# Patient Record
Sex: Female | Born: 1955 | Race: Black or African American | Hispanic: No | Smoking: Former smoker
Health system: Southern US, Community
[De-identification: ages and names within clinical notes are randomized; demographics above are authoritative.]

## PROBLEM LIST (undated history)

## (undated) ENCOUNTER — Emergency Department (HOSPITAL_COMMUNITY): Admission: EM | Payer: Medicare HMO | Source: Home / Self Care

## (undated) DIAGNOSIS — H269 Unspecified cataract: Secondary | ICD-10-CM

## (undated) DIAGNOSIS — F1910 Other psychoactive substance abuse, uncomplicated: Secondary | ICD-10-CM

## (undated) DIAGNOSIS — B019 Varicella without complication: Secondary | ICD-10-CM

## (undated) DIAGNOSIS — R7611 Nonspecific reaction to tuberculin skin test without active tuberculosis: Secondary | ICD-10-CM

## (undated) DIAGNOSIS — D649 Anemia, unspecified: Secondary | ICD-10-CM

## (undated) DIAGNOSIS — T7840XA Allergy, unspecified, initial encounter: Secondary | ICD-10-CM

## (undated) DIAGNOSIS — G4700 Insomnia, unspecified: Secondary | ICD-10-CM

## (undated) HISTORY — PX: ABDOMINAL HYSTERECTOMY: SHX81

## (undated) HISTORY — PX: COLONOSCOPY: SHX174

## (undated) HISTORY — DX: Nonspecific reaction to tuberculin skin test without active tuberculosis: R76.11

## (undated) HISTORY — DX: Insomnia, unspecified: G47.00

## (undated) HISTORY — DX: Varicella without complication: B01.9

## (undated) HISTORY — DX: Unspecified cataract: H26.9

## (undated) HISTORY — DX: Other psychoactive substance abuse, uncomplicated: F19.10

## (undated) HISTORY — DX: Anemia, unspecified: D64.9

## (undated) HISTORY — DX: Allergy, unspecified, initial encounter: T78.40XA

---

## 1998-09-02 ENCOUNTER — Encounter: Payer: Self-pay | Admitting: Emergency Medicine

## 1998-09-02 ENCOUNTER — Emergency Department (HOSPITAL_COMMUNITY): Admission: EM | Admit: 1998-09-02 | Discharge: 1998-09-02 | Payer: Self-pay | Admitting: Emergency Medicine

## 2000-05-08 ENCOUNTER — Emergency Department (HOSPITAL_COMMUNITY): Admission: EM | Admit: 2000-05-08 | Discharge: 2000-05-09 | Payer: Self-pay | Admitting: Emergency Medicine

## 2000-05-10 ENCOUNTER — Other Ambulatory Visit (HOSPITAL_COMMUNITY): Admission: RE | Admit: 2000-05-10 | Discharge: 2000-06-01 | Payer: Self-pay | Admitting: Psychiatry

## 2002-06-23 ENCOUNTER — Emergency Department (HOSPITAL_COMMUNITY): Admission: EM | Admit: 2002-06-23 | Discharge: 2002-06-23 | Payer: Self-pay

## 2002-06-25 ENCOUNTER — Emergency Department (HOSPITAL_COMMUNITY): Admission: EM | Admit: 2002-06-25 | Discharge: 2002-06-25 | Payer: Self-pay | Admitting: Emergency Medicine

## 2002-06-25 ENCOUNTER — Encounter: Payer: Self-pay | Admitting: Emergency Medicine

## 2002-06-29 ENCOUNTER — Emergency Department (HOSPITAL_COMMUNITY): Admission: EM | Admit: 2002-06-29 | Discharge: 2002-06-29 | Payer: Self-pay | Admitting: Emergency Medicine

## 2005-12-15 ENCOUNTER — Ambulatory Visit: Payer: Self-pay | Admitting: Family Medicine

## 2006-01-05 ENCOUNTER — Ambulatory Visit: Payer: Self-pay | Admitting: *Deleted

## 2006-02-22 ENCOUNTER — Ambulatory Visit: Payer: Self-pay | Admitting: Family Medicine

## 2006-08-16 ENCOUNTER — Ambulatory Visit: Payer: Self-pay | Admitting: Family Medicine

## 2006-10-20 ENCOUNTER — Encounter (INDEPENDENT_AMBULATORY_CARE_PROVIDER_SITE_OTHER): Payer: Self-pay | Admitting: *Deleted

## 2007-03-14 ENCOUNTER — Ambulatory Visit: Payer: Self-pay | Admitting: Family Medicine

## 2007-03-17 ENCOUNTER — Ambulatory Visit (HOSPITAL_COMMUNITY): Admission: RE | Admit: 2007-03-17 | Discharge: 2007-03-17 | Payer: Self-pay | Admitting: Family Medicine

## 2007-10-07 ENCOUNTER — Ambulatory Visit: Payer: Self-pay | Admitting: Family Medicine

## 2007-11-25 ENCOUNTER — Ambulatory Visit: Payer: Self-pay | Admitting: Internal Medicine

## 2010-10-17 ENCOUNTER — Other Ambulatory Visit (HOSPITAL_COMMUNITY): Payer: Self-pay | Admitting: Family Medicine

## 2010-10-17 DIAGNOSIS — Z1231 Encounter for screening mammogram for malignant neoplasm of breast: Secondary | ICD-10-CM

## 2010-10-28 ENCOUNTER — Ambulatory Visit (HOSPITAL_COMMUNITY): Payer: Self-pay

## 2010-11-06 ENCOUNTER — Ambulatory Visit (HOSPITAL_COMMUNITY): Payer: Self-pay

## 2010-11-27 ENCOUNTER — Ambulatory Visit (HOSPITAL_COMMUNITY): Payer: Self-pay

## 2010-12-19 ENCOUNTER — Ambulatory Visit (HOSPITAL_COMMUNITY): Payer: Self-pay | Attending: Family Medicine

## 2011-09-22 ENCOUNTER — Encounter: Payer: Self-pay | Admitting: Internal Medicine

## 2012-02-05 ENCOUNTER — Emergency Department (HOSPITAL_COMMUNITY)
Admission: EM | Admit: 2012-02-05 | Discharge: 2012-02-05 | Disposition: A | Discharge status: Home / Self Care | Payer: Self-pay | Attending: Emergency Medicine | Admitting: Emergency Medicine

## 2012-02-05 ENCOUNTER — Encounter (HOSPITAL_COMMUNITY): Payer: Self-pay | Admitting: *Deleted

## 2012-02-05 ENCOUNTER — Emergency Department (HOSPITAL_COMMUNITY): Payer: Self-pay

## 2012-02-05 DIAGNOSIS — R1110 Vomiting, unspecified: Secondary | ICD-10-CM | POA: Insufficient documentation

## 2012-02-05 DIAGNOSIS — F17200 Nicotine dependence, unspecified, uncomplicated: Secondary | ICD-10-CM | POA: Insufficient documentation

## 2012-02-05 DIAGNOSIS — R197 Diarrhea, unspecified: Secondary | ICD-10-CM | POA: Insufficient documentation

## 2012-02-05 DIAGNOSIS — R05 Cough: Secondary | ICD-10-CM | POA: Insufficient documentation

## 2012-02-05 DIAGNOSIS — R509 Fever, unspecified: Secondary | ICD-10-CM | POA: Insufficient documentation

## 2012-02-05 DIAGNOSIS — R0609 Other forms of dyspnea: Secondary | ICD-10-CM | POA: Insufficient documentation

## 2012-02-05 DIAGNOSIS — R059 Cough, unspecified: Secondary | ICD-10-CM | POA: Insufficient documentation

## 2012-02-05 DIAGNOSIS — J111 Influenza due to unidentified influenza virus with other respiratory manifestations: Secondary | ICD-10-CM | POA: Insufficient documentation

## 2012-02-05 DIAGNOSIS — R51 Headache: Secondary | ICD-10-CM | POA: Insufficient documentation

## 2012-02-05 DIAGNOSIS — R0989 Other specified symptoms and signs involving the circulatory and respiratory systems: Secondary | ICD-10-CM | POA: Insufficient documentation

## 2012-02-05 DIAGNOSIS — R42 Dizziness and giddiness: Secondary | ICD-10-CM | POA: Insufficient documentation

## 2012-02-05 LAB — COMPREHENSIVE METABOLIC PANEL
ALT: 27 U/L (ref 0–35)
AST: 44 U/L — ABNORMAL HIGH (ref 0–37)
Albumin: 3.7 g/dL (ref 3.5–5.2)
Alkaline Phosphatase: 138 U/L — ABNORMAL HIGH (ref 39–117)
BUN: 10 mg/dL (ref 6–23)
CO2: 23 mEq/L (ref 19–32)
Calcium: 9.2 mg/dL (ref 8.4–10.5)
Chloride: 102 mEq/L (ref 96–112)
Creatinine, Ser: 1.14 mg/dL — ABNORMAL HIGH (ref 0.50–1.10)
GFR calc Af Amer: 60 mL/min — ABNORMAL LOW (ref >90)
GFR calc non Af Amer: 52 mL/min — ABNORMAL LOW (ref >90)
Glucose, Bld: 103 mg/dL — ABNORMAL HIGH (ref 70–99)
Potassium: 4.1 mEq/L (ref 3.5–5.1)
Sodium: 138 mEq/L (ref 135–145)
Total Bilirubin: 0.2 mg/dL — ABNORMAL LOW (ref 0.3–1.2)
Total Protein: 7.4 g/dL (ref 6.0–8.3)

## 2012-02-05 LAB — CBC WITH DIFFERENTIAL/PLATELET
Basophils Absolute: 0 10*3/uL (ref 0.0–0.1)
Basophils Relative: 1 % (ref 0–1)
Eosinophils Absolute: 0.2 10*3/uL (ref 0.0–0.7)
Eosinophils Relative: 3 % (ref 0–5)
HCT: 44.3 % (ref 36.0–46.0)
Hemoglobin: 14.1 g/dL (ref 12.0–15.0)
Lymphocytes Relative: 36 % (ref 12–46)
Lymphs Abs: 2.4 10*3/uL (ref 0.7–4.0)
MCH: 24.6 pg — ABNORMAL LOW (ref 26.0–34.0)
MCHC: 31.8 g/dL (ref 30.0–36.0)
MCV: 77.2 fL — ABNORMAL LOW (ref 78.0–100.0)
Monocytes Absolute: 0.8 10*3/uL (ref 0.1–1.0)
Monocytes Relative: 12 % (ref 3–12)
Neutro Abs: 3.3 10*3/uL (ref 1.7–7.7)
Neutrophils Relative %: 49 % (ref 43–77)
Platelets: 213 10*3/uL (ref 150–400)
RBC: 5.74 MIL/uL — ABNORMAL HIGH (ref 3.87–5.11)
RDW: 14.9 % (ref 11.5–15.5)
WBC: 6.7 10*3/uL (ref 4.0–10.5)

## 2012-02-05 IMAGING — CR DG CHEST 2V
2 series · 2 of 2 positions shown · non-contrast
Comparison: None.

CLINICAL DATA: Cough, congestion for 3 days

CHEST - 2 VIEW

[w chest pa]
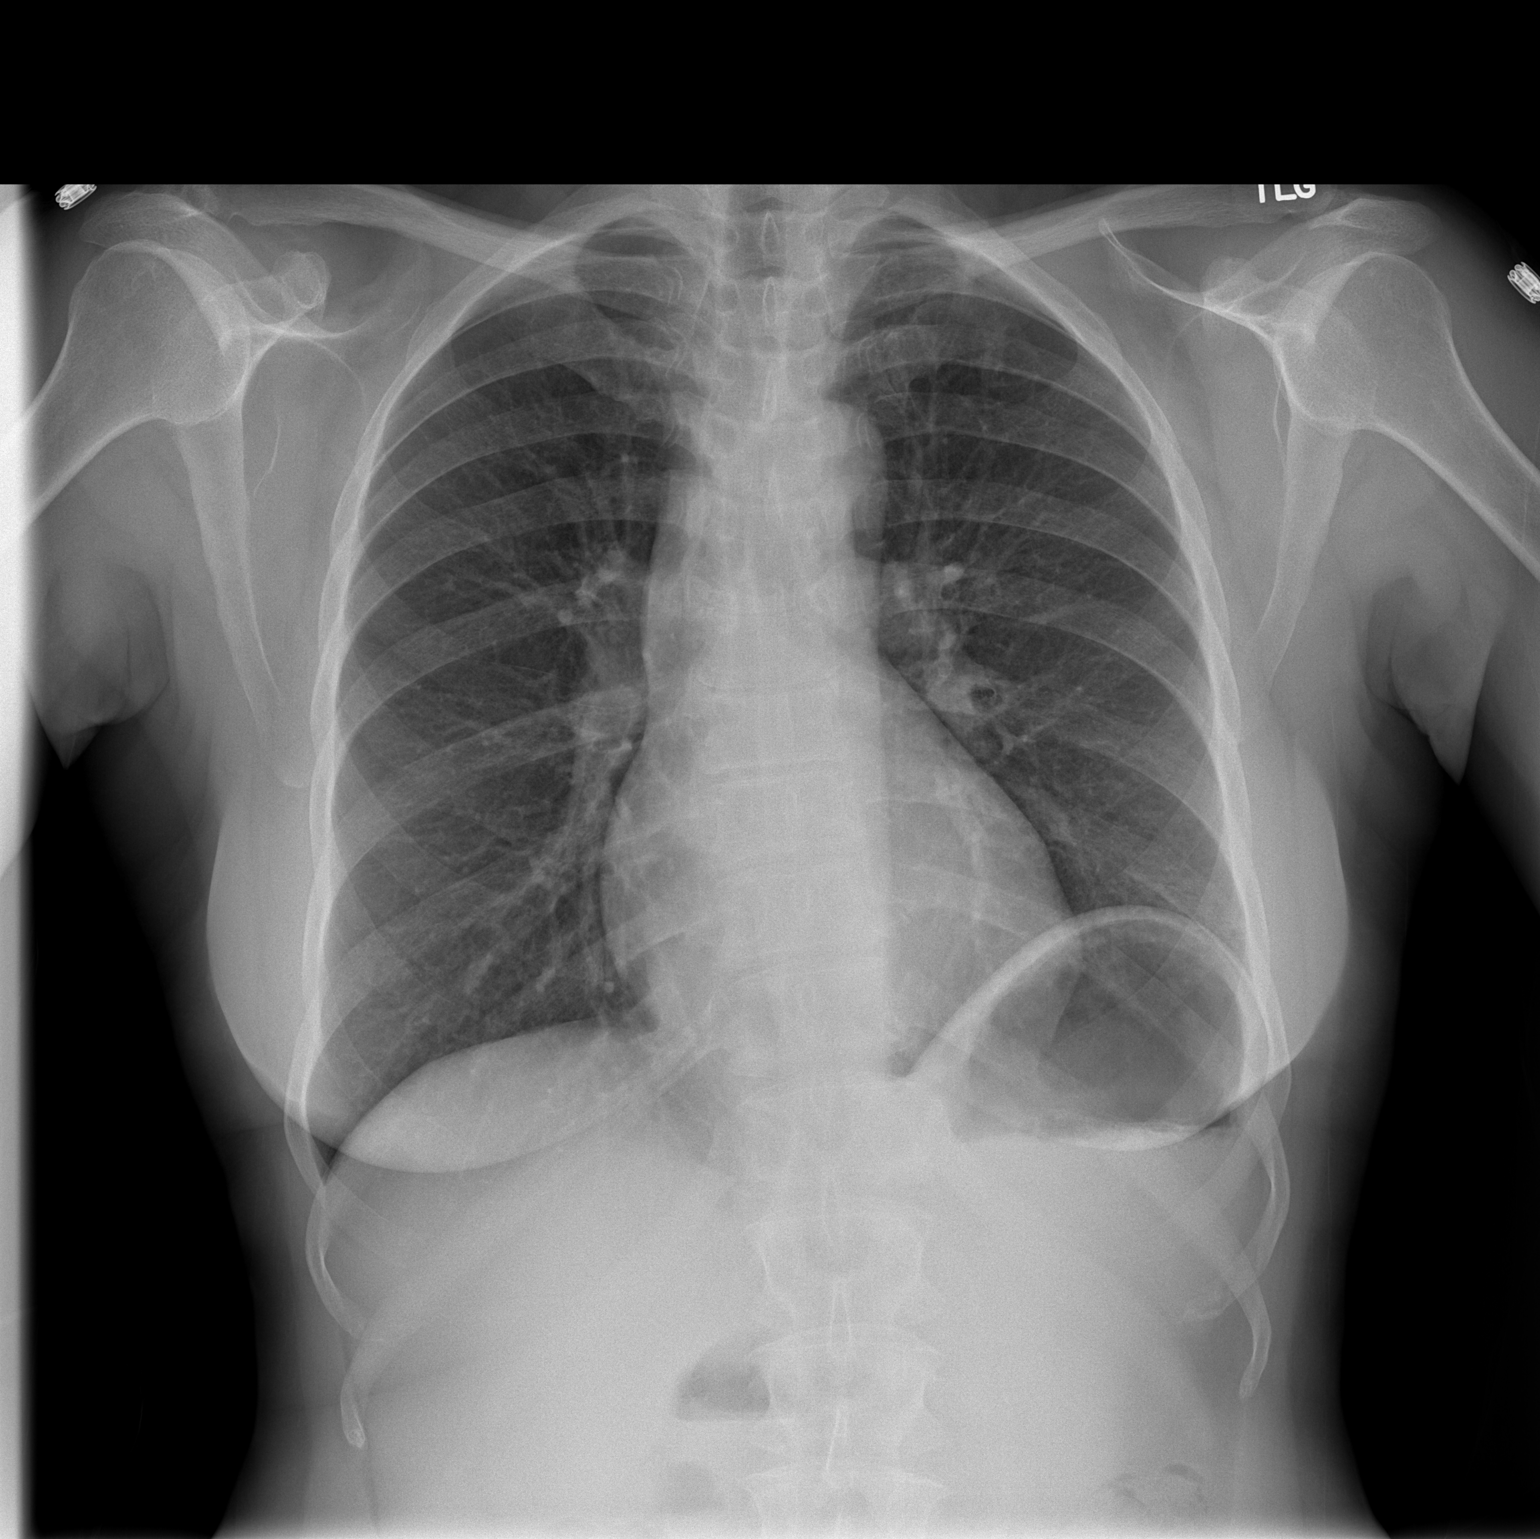

[w chest lat]
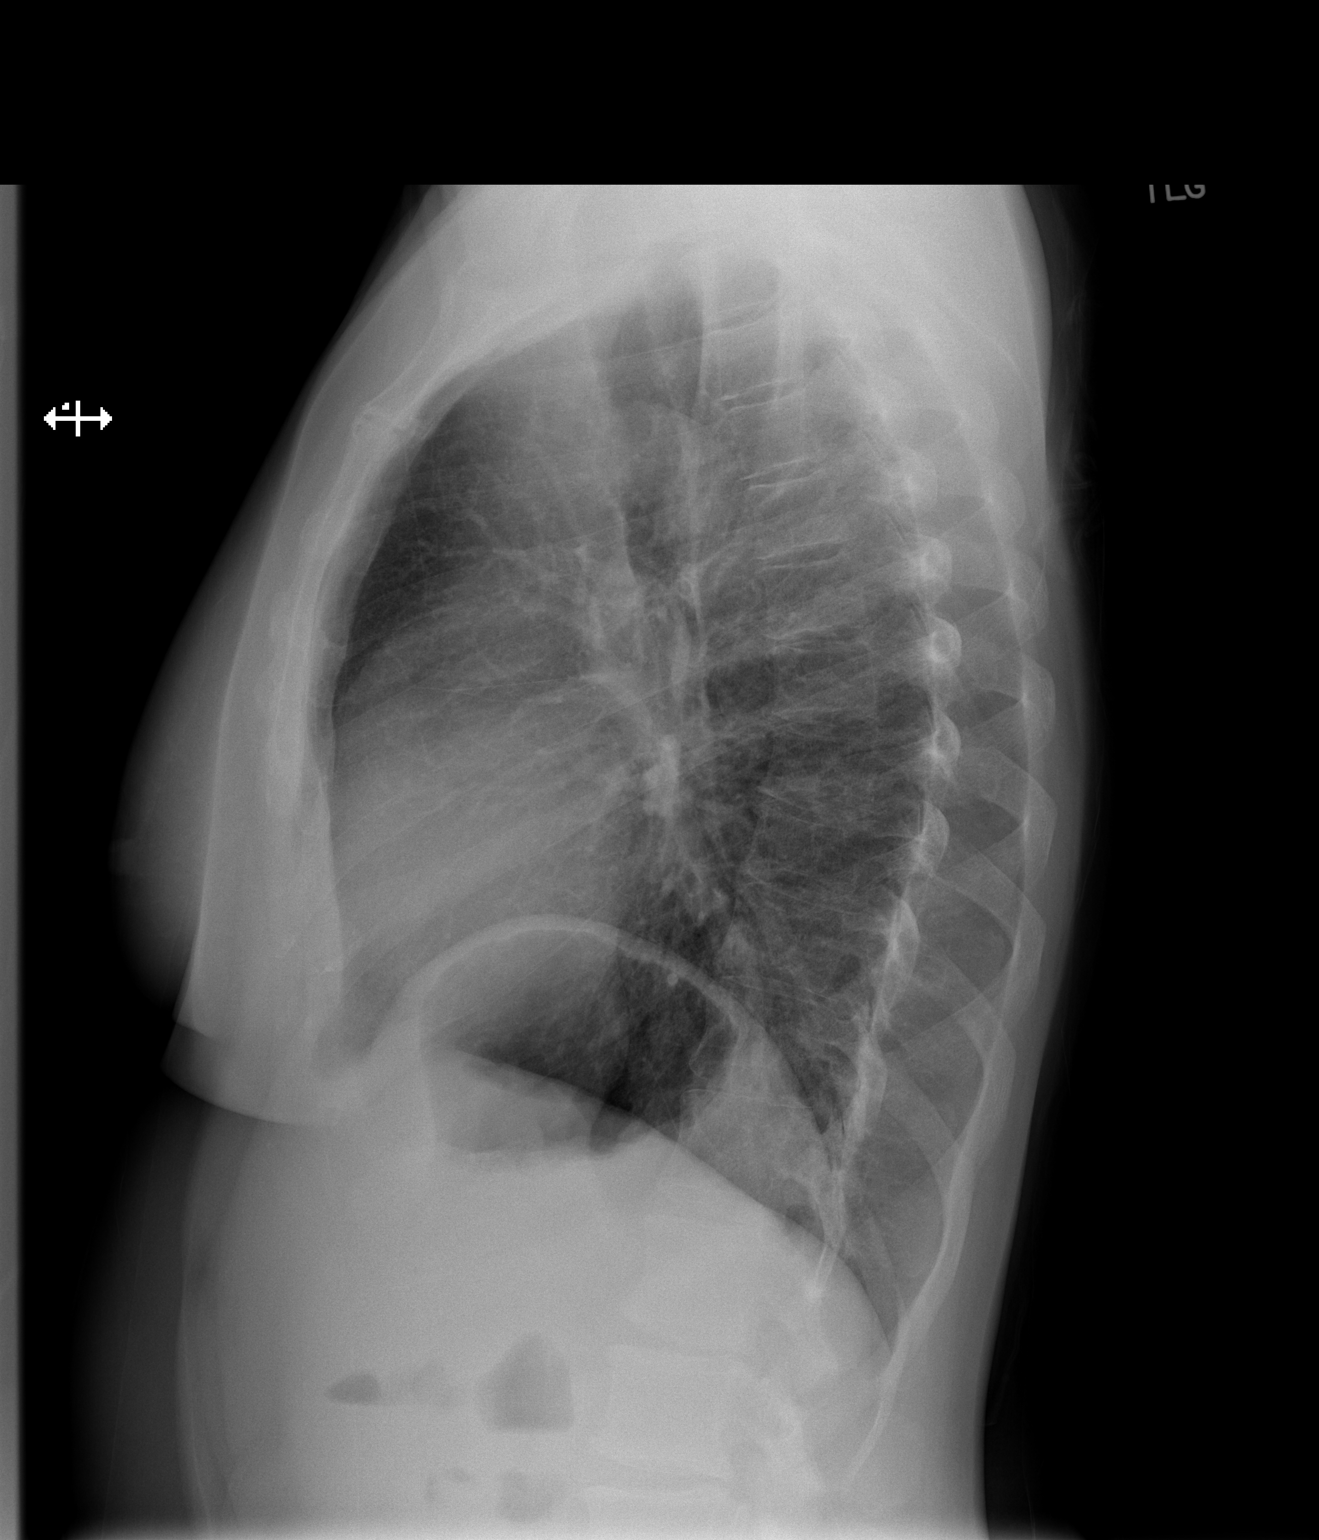

[2 of 2 positions shown; findings below may reference images not displayed]

FINDINGS: No active infiltrate or effusion is seen.  Mediastinal
contours appear normal.  The heart is within normal limits in size.
No bony abnormality is seen.
IMPRESSION: No active lung disease.

## 2012-02-05 MED ORDER — ALBUTEROL SULFATE (5 MG/ML) 0.5% IN NEBU
2.5000 mg | INHALATION_SOLUTION | Freq: Once | RESPIRATORY_TRACT | Status: AC
  Administered 2012-02-05: 2.5 mg via RESPIRATORY_TRACT
  Filled 2012-02-05: qty 20

## 2012-02-05 MED ORDER — IPRATROPIUM BROMIDE 0.02 % IN SOLN
0.5000 mg | Freq: Once | RESPIRATORY_TRACT | Status: AC
  Administered 2012-02-05: 0.5 mg via RESPIRATORY_TRACT
  Filled 2012-02-05: qty 2.5

## 2012-02-05 MED ORDER — SODIUM CHLORIDE 0.9 % IV BOLUS (SEPSIS)
1000.0000 mL | Freq: Once | INTRAVENOUS | Status: AC
  Administered 2012-02-05: 1000 mL via INTRAVENOUS

## 2012-02-05 MED ORDER — OXYCODONE-ACETAMINOPHEN 5-325 MG PO TABS: 1.0000 | ORAL_TABLET | ORAL | Status: DC | PRN

## 2012-02-05 MED ORDER — ALBUTEROL SULFATE HFA 108 (90 BASE) MCG/ACT IN AERS
2.0000 | INHALATION_SPRAY | RESPIRATORY_TRACT | Status: DC | PRN
  Administered 2012-02-05: 2 via RESPIRATORY_TRACT
  Filled 2012-02-05 (×2): qty 6.7

## 2012-02-05 NOTE — ED Notes (Signed)
Pt reports N/V/D two days ago but denies at this time. Last NBM today.

## 2012-02-05 NOTE — ED Notes (Signed)
Pt dc to home.  Pt states understanding to dc paperwork.  Pt ambulatory to exit without difficulty.  Denies need for w/c.

## 2012-02-05 NOTE — ED Provider Notes (Signed)
History     CSN: [id number]  Arrival date & time 02/05/12  1548   First MD Initiated Contact with Patient 02/05/12 1646      Chief Complaint  Patient presents with  . URI  . Headache  . Dizziness    (Consider location/radiation/quality/duration/timing/severity/associated sxs/prior treatment) Patient is a 57 y.o. female presenting with URI and headaches. The history is provided by the patient.  URI The primary symptoms include headaches.  Headache   She has been sick for 3 days. She started with fever, chills, vomiting, diarrhea and a cough productive of yellow sputum which is occasionally blood-streaked. She did not take her temperature at home. Vomiting and diarrhea have subsided, but 2 days ago she started having a sensation like pins pricking her underlying moving up to her torso and into her arm. This is mainly happening at night. She continues to have a cough although does seem to be improving. She was having some dyspnea initially which is also improving. She is now complaining of a frontal headache. She's tried some over-the-counter medications with no relief. She denies any sick contacts. She did not get her influenza immunization this year.  History reviewed. No pertinent past medical history.  History reviewed. No pertinent past surgical history.  History reviewed. No pertinent family history.  History  Substance Use Topics  . Smoking status: Current Some Day Smoker    Types: Cigarettes  . Smokeless tobacco: Not on file  . Alcohol Use: No     Comment: occ    OB History    Grav Para Term Preterm Abortions TAB SAB Ect Mult Living                  Review of Systems  Neurological: Positive for headaches.  All other systems reviewed and are negative.    Allergies  Review of patient's allergies indicates no known allergies.  Home Medications  No current outpatient prescriptions on file.  BP 136/85  Pulse 79  Temp 98.3 F (36.8 C) (Oral)  Resp 18  SpO2  99%  Physical Exam  Nursing note and vitals reviewed. 57 year old female, resting comfortably and in no acute distress. Vital signs are normal. Oxygen saturation is 99%, which is normal. Head is normocephalic and atraumatic. PERRLA, EOMI. Oropharynx is clear. There is no sinus tenderness. Examination is without lesions no edema of the turbinates no drainage. Neck is nontender and supple without adenopathy or JVD. Back is nontender and there is no CVA tenderness. Lungs have mild to moderate wheezing noted with forced exhalation. No wheezing with tidal breathing. No rales or rhonchi are heard. Chest is nontender. Heart has regular rate and rhythm without murmur. Abdomen is soft, flat, nontender without masses or hepatosplenomegaly and peristalsis is normoactive. Extremities have no cyanosis or edema, full range of motion is present. Skin is warm and dry without rash. Neurologic: Mental status is normal, cranial nerves are intact, there are no motor or sensory deficits.   ED Course  Procedures (including critical care time)  Results for orders placed during the hospital encounter of 02/05/12  CBC WITH DIFFERENTIAL      Component Value Range   WBC 6.7  4.0 - 10.5 K/uL   RBC 5.74 (*) 3.87 - 5.11 MIL/uL   Hemoglobin 14.1  12.0 - 15.0 g/dL   HCT 78.2  95.6 - 21.3 %   MCV 77.2 (*) 78.0 - 100.0 fL   MCH 24.6 (*) 26.0 - 34.0 pg   MCHC 31.8  30.0 - 36.0 g/dL   RDW 30.8  65.7 - 84.6 %   Platelets 213  150 - 400 K/uL   Neutrophils Relative 49  43 - 77 %   Neutro Abs 3.3  1.7 - 7.7 K/uL   Lymphocytes Relative 36  12 - 46 %   Lymphs Abs 2.4  0.7 - 4.0 K/uL   Monocytes Relative 12  3 - 12 %   Monocytes Absolute 0.8  0.1 - 1.0 K/uL   Eosinophils Relative 3  0 - 5 %   Eosinophils Absolute 0.2  0.0 - 0.7 K/uL   Basophils Relative 1  0 - 1 %   Basophils Absolute 0.0  0.0 - 0.1 K/uL  COMPREHENSIVE METABOLIC PANEL      Component Value Range   Sodium 138  135 - 145 mEq/L   Potassium 4.1  3.5 -  5.1 mEq/L   Chloride 102  96 - 112 mEq/L   CO2 23  19 - 32 mEq/L   Glucose, Bld 103 (*) 70 - 99 mg/dL   BUN 10  6 - 23 mg/dL   Creatinine, Ser 9.62 (*) 0.50 - 1.10 mg/dL   Calcium 9.2  8.4 - 95.2 mg/dL   Total Protein 7.4  6.0 - 8.3 g/dL   Albumin 3.7  3.5 - 5.2 g/dL   AST 44 (*) 0 - 37 U/L   ALT 27  0 - 35 U/L   Alkaline Phosphatase 138 (*) 39 - 117 U/L   Total Bilirubin 0.2 (*) 0.3 - 1.2 mg/dL   GFR calc non Af Amer 52 (*) >90 mL/min   GFR calc Af Amer 60 (*) >90 mL/min   Dg Chest 2 View  02/05/2012  *RADIOLOGY REPORT*  Clinical Data: Cough, congestion for 3 days  CHEST - 2 VIEW  Comparison: None.  Findings: No active infiltrate or effusion is seen.  Mediastinal contours appear normal.  The heart is within normal limits in size. No bony abnormality is seen.  IMPRESSION: No active lung disease.   Original Report Authenticated By: Dwyane Dee, M.D.       1. Influenza       MDM  Symptom complex seems most consistent with influenza. She has had symptoms too long to qualify for an antiviral treatment. Chest x-ray has been obtained and shows no evidence of pneumonia. She'll be given IV hydration and she'll be given albuterol with Atrovent. At this point, I do not see an indication for antibiotics.  She feels much better after IV fluids and albuterol with Atrovent. She's given an albuterol inhaler to take home and prescriptions given for Percocet for pain. She is given physician referral number to try and get established with a PCP.     Dione Booze, MD 02/05/12 [id number]

## 2012-02-05 NOTE — ED Notes (Signed)
Pt has multiple complaints. Reports headache, productive cough x 3 days, having dizziness. Reports one day of vomiting and diarrhea, has resolved since. No acute distress noted at triage.

## 2014-05-04 ENCOUNTER — Ambulatory Visit (INDEPENDENT_AMBULATORY_CARE_PROVIDER_SITE_OTHER): Payer: 59 | Admitting: Family

## 2014-05-04 ENCOUNTER — Other Ambulatory Visit (INDEPENDENT_AMBULATORY_CARE_PROVIDER_SITE_OTHER): Payer: 59

## 2014-05-04 ENCOUNTER — Encounter: Payer: Self-pay | Admitting: Family

## 2014-05-04 VITALS — BP 130/90 | HR 75 | Temp 98.2°F | Wt 148.8 lb

## 2014-05-04 DIAGNOSIS — J302 Other seasonal allergic rhinitis: Secondary | ICD-10-CM | POA: Diagnosis not present

## 2014-05-04 DIAGNOSIS — M792 Neuralgia and neuritis, unspecified: Secondary | ICD-10-CM

## 2014-05-04 DIAGNOSIS — Z7689 Persons encountering health services in other specified circumstances: Secondary | ICD-10-CM

## 2014-05-04 DIAGNOSIS — K047 Periapical abscess without sinus: Secondary | ICD-10-CM | POA: Insufficient documentation

## 2014-05-04 DIAGNOSIS — Z1231 Encounter for screening mammogram for malignant neoplasm of breast: Secondary | ICD-10-CM | POA: Diagnosis not present

## 2014-05-04 DIAGNOSIS — Z7189 Other specified counseling: Secondary | ICD-10-CM

## 2014-05-04 LAB — HEMOGLOBIN A1C: Hgb A1c MFr Bld: 5.9 % (ref 4.6–6.5)

## 2014-05-04 MED ORDER — LORATADINE 10 MG PO TABS: 10.0000 mg | ORAL_TABLET | Freq: Every day | ORAL | Status: DC

## 2014-05-04 MED ORDER — FLUTICASONE PROPIONATE 50 MCG/ACT NA SUSP: 2.0000 | Freq: Every day | NASAL | Status: DC

## 2014-05-04 MED ORDER — AMOXICILLIN-POT CLAVULANATE 875-125 MG PO TABS: 1.0000 | ORAL_TABLET | Freq: Two times a day (BID) | ORAL | Status: DC

## 2014-05-04 MED ORDER — NAPROXEN 500 MG PO TABS: 500.0000 mg | ORAL_TABLET | Freq: Two times a day (BID) | ORAL | Status: DC

## 2014-05-04 NOTE — Patient Instructions (Addendum)
Thank you for choosing [facility].  Summary/Instructions:  Your prescription(s) have been submitted to your pharmacy or been printed and provided for you. Please take as directed and contact our office if you believe you are having problem(s) with the medication(s) or have any questions.  Please stop by the lab on the basement level of the building for your blood work. Your results will be released to [place] (or called to you) after review, usually within 72 hours after test completion. If any changes need to be made, you will be notified at that same time.  Please stop by radiology on the basement level of the building for your x-rays. Your results will be released to [place] (or called to you) after review, usually within 72 hours after test completion. If any treatments or changes are necessary, you will be notified at that same time.  Referrals have been made during this visit. You should expect to hear back from our schedulers in about 14  days in regards to establishing an appointment with the specialists we discussed.   If your symptoms worsen or fail to improve, please contact our office for further instruction, or in case of emergency go directly to the emergency room at the closest medical facility.   Peripheral Neuropathy Peripheral neuropathy is a type of nerve damage. It affects nerves that carry signals between the spinal cord and other parts of the body. These are called peripheral nerves. With peripheral neuropathy, one nerve or a group of nerves may be damaged.  CAUSES  Many things can damage peripheral nerves. For some people with peripheral neuropathy, the cause is unknown. Some causes include:  Diabetes. This is the most common cause of peripheral neuropathy.  Injury to a nerve.  Pressure or stress on a nerve that lasts a long time.  Too little vitamin B. Alcoholism can lead to this.  Infections.  Autoimmune diseases, such as multiple sclerosis and systemic  lupus erythematosus.  Inherited nerve diseases.  Some medicines, such as cancer drugs.  Toxic substances, such as lead and mercury.  Too little blood flowing to the legs.  Kidney disease.  Thyroid disease. SIGNS AND SYMPTOMS  Different people have different symptoms. The symptoms you have will depend on which of your nerves is damaged. Common symptoms include:  Loss of feeling (numbness) in the feet and hands.  Tingling in the feet and hands.  Pain that burns.  Very sensitive skin.  Weakness.  Not being able to move a part of the body (paralysis).  Muscle twitching.  Clumsiness or poor coordination.  Loss of balance.  Not being able to control your bladder.  Feeling dizzy.  Sexual problems. DIAGNOSIS  Peripheral neuropathy is a symptom, not a disease. Finding the cause of peripheral neuropathy can be hard. To figure that out, your health care provider will take a medical history and do a physical exam. A neurological exam will also be done. This involves checking things affected by your brain, spinal cord, and nerves (nervous system). For example, your health care provider will check your reflexes, how you move, and what you can feel.  Other types of tests may also be ordered, such as:  Blood tests.  A test of the fluid in your spinal cord.  Imaging tests, such as CT scans or an MRI.  Electromyography (EMG). This test checks the nerves that control muscles.  Nerve conduction velocity tests. These tests check how fast messages pass through your nerves.  Nerve biopsy. A small piece of  nerve is removed. It is then checked under a microscope. TREATMENT   Medicine is often used to treat peripheral neuropathy. Medicines may include:  Pain-relieving medicines. Prescription or over-the-counter medicine may be suggested.  Antiseizure medicine. This may be used for pain.  Antidepressants. These also may help ease pain from neuropathy.  Lidocaine. This is a  numbing medicine. You might wear a patch or be given a shot.  Mexiletine. This medicine is typically used to help control irregular heart rhythms.  Surgery. Surgery may be needed to relieve pressure on a nerve or to destroy a nerve that is causing pain.  Physical therapy to help movement.  Assistive devices to help movement. HOME CARE INSTRUCTIONS   Only take over-the-counter or prescription medicines as directed by your health care provider. Follow the instructions carefully for any given medicines. Do not take any other medicines without first getting approval from your health care provider.  If you have diabetes, work closely with your health care provider to keep your blood sugar under control.  If you have numbness in your feet:  Check every day for signs of injury or infection. Watch for redness, warmth, and swelling.  Wear padded socks and comfortable shoes. These help protect your feet.  Do not do things that put pressure on your damaged nerve.  Do not smoke. Smoking keeps blood from getting to damaged nerves.  Avoid or limit alcohol. Too much alcohol can cause a lack of B vitamins. These vitamins are needed for healthy nerves.  Develop a good support system. Coping with peripheral neuropathy can be stressful. Talk to a mental health specialist or join a support group if you are struggling.  Follow up with your health care provider as directed. SEEK MEDICAL CARE IF:   You have new signs or symptoms of peripheral neuropathy.  You are struggling emotionally from dealing with peripheral neuropathy.  You have a fever. SEEK IMMEDIATE MEDICAL CARE IF:   You have an injury or infection that is not healing.  You feel very dizzy or begin vomiting.  You have chest pain.  You have trouble breathing. Document Released: 01/09/2002 Document Revised: 10/01/2010 Document Reviewed: 09/26/2012 [facility] Patient Information 2015 [place], [place]. This information is not intended to  replace advice given to you by your health care provider. Make sure you discuss any questions you have with your health care provider.  Dental Abscess A dental abscess is a collection of infected fluid (pus) from a bacterial infection in the inner part of the tooth (pulp). It usually occurs at the end of the tooth's root.  CAUSES   Severe tooth decay.  Trauma to the tooth that allows bacteria to enter into the pulp, such as a broken or chipped tooth. SYMPTOMS   Severe pain in and around the infected tooth.  Swelling and redness around the abscessed tooth or in the mouth or face.  Tenderness.  Pus drainage.  Bad breath.  Bitter taste in the mouth.  Difficulty swallowing.  Difficulty opening the mouth.  Nausea.  Vomiting.  Chills.  Swollen neck glands. DIAGNOSIS   A medical and dental history will be taken.  An examination will be performed by tapping on the abscessed tooth.  X-rays may be taken of the tooth to identify the abscess. TREATMENT The goal of treatment is to eliminate the infection. You may be prescribed antibiotic medicine to stop the infection from spreading. A root canal may be performed to save the tooth. If the tooth cannot be saved, it  may be pulled (extracted) and the abscess may be drained.  HOME CARE INSTRUCTIONS  Only take over-the-counter or prescription medicines for pain, fever, or discomfort as directed by your caregiver.  Rinse your mouth (gargle) often with salt water ( tsp salt in 8 oz [250 ml] of warm water) to relieve pain or swelling.  Do not drive after taking pain medicine (narcotics).  Do not apply heat to the outside of your face.  Return to your dentist for further treatment as directed. SEEK MEDICAL CARE IF:  Your pain is not helped by medicine.  Your pain is getting worse instead of better. SEEK IMMEDIATE MEDICAL CARE IF:  You have a fever or persistent symptoms for more than 2-3 days.  You have a fever and your  symptoms suddenly get worse.  You have chills or a very bad headache.  You have problems breathing or swallowing.  You have trouble opening your mouth.  You have swelling in the neck or around the eye. Document Released: 01/19/2005 Document Revised: 10/14/2011 Document Reviewed: 04/29/2010 [facility] Patient Information 2015 [place], [place]. This information is not intended to replace advice given to you by your health care provider. Make sure you discuss any questions you have with your health care provider.

## 2014-05-04 NOTE — Assessment & Plan Note (Signed)
Symptoms consistent with potential peripheral neuropathy. Obtain A1c to rule out diabetes and B12 to rule out deficiency. Given previous intolerance to gabapentin, if symptoms worsen or fail to improve will refer to neurology for further management.

## 2014-05-04 NOTE — Progress Notes (Signed)
   Subjective:    Patient ID: Mallory Bailey, female    DOB: [birth year], 59 y.o.   MRN: [id number]   Chief Complaint  Patient presents with  . Establish Care    Dental abscess, nerve pain, mouth infection    HPI:  Mallory Bailey is a 59 y.o. female who presents today to establish care and discuss medical conditions.  1) Allergies - Associated symptoms of sneezing, watering eyes, rhinorria, and congestion on the right. It waxes and wanes with the weather and does not recall the exact onset of the current episode. Currently taking mucinex which seems to help some.  2) Nerve pain - Was previously being seen at a "clinic" and was prescribed gabapentin however did not like the way the medication made her feel. Associated symptoms of distal nerve pain in her toes and fingertips. This occurs occasionally, but more often than it previously has.   3) Mouth/Gum infection - Associated symptom of a dental abscess has been going on for a couple of months. Indicates she has a dental appointment, but is a month away. Lesion is located on the right side of her mouth and indicates that there is drainage from the site. Pain is described as sharp pain and is rated a 7/10.  4) Mammogram - need for mammogram.   Review of Systems  Constitutional: Negative for fever and chills.  HENT: Positive for congestion.        Positive for mouth pain.   Neurological: Positive for numbness.      Objective:    BP 130/90 mmHg  Pulse 75  Temp(Src) 98.2 F (36.8 C) (Oral)  Wt 148 lb 12 oz (67.473 kg)  SpO2 98% Nursing note and vital signs reviewed.  Physical Exam  Constitutional: She is oriented to person, place, and time. She appears well-developed and well-nourished. No distress.  HENT:  Right Ear: Hearing, tympanic membrane, external ear and ear canal normal.  Left Ear: Hearing, tympanic membrane, external ear and ear canal normal.  Nose: Nose normal.  Mouth/Throat: Uvula is midline, oropharynx  is clear and moist and mucous membranes are normal.    Cardiovascular: Normal rate, regular rhythm, normal heart sounds and intact distal pulses.   Pulmonary/Chest: Effort normal and breath sounds normal.  Neurological: She is alert and oriented to person, place, and time.  Mild decrease in sensation noted in distal phalanges of hand and feet. Gross sensation is intact.  Skin: Skin is warm and dry.  Psychiatric: She has a normal mood and affect. Her behavior is normal. Judgment and thought content normal.       Assessment & Plan:

## 2014-05-04 NOTE — Assessment & Plan Note (Signed)
Patient not currently taking any medications for seasonal allergies. Discussed starting second-generation antihistamine and Flonase. Follow up pending trial of new medications.

## 2014-05-04 NOTE — Assessment & Plan Note (Signed)
Symptoms and exam consistent with dental abscess. Start Augmentin. Patient has follow-up with appointment with dentist in approximately one month.

## 2014-05-04 NOTE — Progress Notes (Signed)
Pre visit review using our clinic review tool, if applicable. No additional management support is needed unless otherwise documented below in the visit note. 

## 2014-05-07 LAB — METHYLMALONIC ACID, SERUM: Methylmalonic Acid, Quant: 77 nmol/L — ABNORMAL LOW (ref 87–318)

## 2014-05-08 ENCOUNTER — Telehealth: Payer: Self-pay | Admitting: Family

## 2014-05-08 NOTE — Telephone Encounter (Signed)
LVM for pt to call back.

## 2014-05-08 NOTE — Telephone Encounter (Signed)
Your A1c is 5.9 indicating that you do not have diabetes, however your B12 levels were slightly low for this I recommend 1,000 micrograms per day of B12 which is available over the counter. This may help with some of the tingling.

## 2014-05-09 NOTE — Telephone Encounter (Signed)
Pt aware of results 

## 2014-05-17 ENCOUNTER — Ambulatory Visit: Payer: Self-pay | Admitting: Gynecology

## 2014-10-12 ENCOUNTER — Telehealth: Payer: Self-pay | Admitting: General Practice

## 2014-10-12 NOTE — Telephone Encounter (Signed)
Left message to remind patient to schedule annual mammogram.

## 2015-02-27 ENCOUNTER — Ambulatory Visit: Payer: Self-pay | Admitting: Internal Medicine

## 2015-03-01 ENCOUNTER — Ambulatory Visit: Payer: Self-pay | Admitting: Family

## 2015-10-10 ENCOUNTER — Ambulatory Visit: Payer: Self-pay | Admitting: Family

## 2015-10-10 DIAGNOSIS — Z0289 Encounter for other administrative examinations: Secondary | ICD-10-CM

## 2015-12-06 ENCOUNTER — Encounter (HOSPITAL_COMMUNITY): Payer: Self-pay

## 2015-12-06 ENCOUNTER — Emergency Department (HOSPITAL_COMMUNITY): Payer: BLUE CROSS/BLUE SHIELD

## 2015-12-06 ENCOUNTER — Emergency Department (HOSPITAL_COMMUNITY)
Admission: EM | Admit: 2015-12-06 | Discharge: 2015-12-06 | Disposition: A | Discharge status: Home / Self Care | Payer: BLUE CROSS/BLUE SHIELD | Attending: Emergency Medicine | Admitting: Emergency Medicine

## 2015-12-06 DIAGNOSIS — F17210 Nicotine dependence, cigarettes, uncomplicated: Secondary | ICD-10-CM | POA: Diagnosis not present

## 2015-12-06 DIAGNOSIS — F1490 Cocaine use, unspecified, uncomplicated: Secondary | ICD-10-CM | POA: Insufficient documentation

## 2015-12-06 DIAGNOSIS — J189 Pneumonia, unspecified organism: Secondary | ICD-10-CM | POA: Diagnosis not present

## 2015-12-06 DIAGNOSIS — Z79899 Other long term (current) drug therapy: Secondary | ICD-10-CM | POA: Insufficient documentation

## 2015-12-06 DIAGNOSIS — J181 Lobar pneumonia, unspecified organism: Secondary | ICD-10-CM

## 2015-12-06 DIAGNOSIS — R079 Chest pain, unspecified: Secondary | ICD-10-CM | POA: Diagnosis present

## 2015-12-06 LAB — BASIC METABOLIC PANEL
Anion gap: 9 (ref 5–15)
BUN: 5 mg/dL — AB (ref 6–20)
CHLORIDE: 99 mmol/L — AB (ref 101–111)
CO2: 28 mmol/L (ref 22–32)
Calcium: 9.4 mg/dL (ref 8.9–10.3)
Creatinine, Ser: 0.86 mg/dL (ref 0.44–1.00)
GFR calc Af Amer: >60 mL/min (ref >60)
GFR calc non Af Amer: >60 mL/min (ref >60)
Glucose, Bld: 106 mg/dL — ABNORMAL HIGH (ref 65–99)
POTASSIUM: 4 mmol/L (ref 3.5–5.1)
SODIUM: 136 mmol/L (ref 135–145)

## 2015-12-06 LAB — CBC
HEMATOCRIT: 44.6 % (ref 36.0–46.0)
HEMOGLOBIN: 14.4 g/dL (ref 12.0–15.0)
MCH: 24.6 pg — ABNORMAL LOW (ref 26.0–34.0)
MCHC: 32.3 g/dL (ref 30.0–36.0)
MCV: 76.2 fL — AB (ref 78.0–100.0)
Platelets: 205 10*3/uL (ref 150–400)
RBC: 5.85 MIL/uL — ABNORMAL HIGH (ref 3.87–5.11)
RDW: 14.8 % (ref 11.5–15.5)
WBC: 15.4 10*3/uL — AB (ref 4.0–10.5)

## 2015-12-06 LAB — RAPID URINE DRUG SCREEN, HOSP PERFORMED
Amphetamines: NOT DETECTED
Barbiturates: NOT DETECTED
Benzodiazepines: NOT DETECTED
COCAINE: POSITIVE — AB
OPIATES: NOT DETECTED
TETRAHYDROCANNABINOL: NOT DETECTED

## 2015-12-06 LAB — LIPASE, BLOOD: LIPASE: 22 U/L (ref 11–51)

## 2015-12-06 LAB — HEPATIC FUNCTION PANEL
ALBUMIN: 3.8 g/dL (ref 3.5–5.0)
ALK PHOS: 121 U/L (ref 38–126)
ALT: 19 U/L (ref 14–54)
AST: 29 U/L (ref 15–41)
BILIRUBIN DIRECT: 0.2 mg/dL (ref 0.1–0.5)
BILIRUBIN TOTAL: 0.8 mg/dL (ref 0.3–1.2)
Indirect Bilirubin: 0.6 mg/dL (ref 0.3–0.9)
Total Protein: 7.3 g/dL (ref 6.5–8.1)

## 2015-12-06 LAB — D-DIMER, QUANTITATIVE (NOT AT ARMC): D DIMER QUANT: 0.64 ug{FEU}/mL — AB (ref 0.00–0.50)

## 2015-12-06 LAB — I-STAT TROPONIN, ED
Troponin i, poc: 0 ng/mL (ref 0.00–0.08)
Troponin i, poc: 0 ng/mL (ref 0.00–0.08)

## 2015-12-06 MED ORDER — ALBUTEROL SULFATE HFA 108 (90 BASE) MCG/ACT IN AERS
2.0000 | INHALATION_SPRAY | Freq: Once | RESPIRATORY_TRACT | Status: AC
  Administered 2015-12-06: 2 via RESPIRATORY_TRACT
  Filled 2015-12-06: qty 6.7

## 2015-12-06 MED ORDER — AZITHROMYCIN 250 MG PO TABS
1000.0000 mg | ORAL_TABLET | Freq: Once | ORAL | Status: AC
Start: 2015-12-06 — End: 2015-12-06
  Administered 2015-12-06: 1000 mg via ORAL
  Filled 2015-12-06: qty 4

## 2015-12-06 MED ORDER — KETOROLAC TROMETHAMINE 30 MG/ML IJ SOLN
30.0000 mg | Freq: Once | INTRAMUSCULAR | Status: AC
  Administered 2015-12-06: 30 mg via INTRAVENOUS
  Filled 2015-12-06: qty 1

## 2015-12-06 MED ORDER — IOPAMIDOL (ISOVUE-370) INJECTION 76%
INTRAVENOUS | Status: AC
  Administered 2015-12-06: 80 mL
  Filled 2015-12-06: qty 100

## 2015-12-06 MED ORDER — AMOXICILLIN 500 MG PO CAPS: 500.0000 mg | ORAL_CAPSULE | Freq: Three times a day (TID) | ORAL | 0 refills | Status: DC

## 2015-12-06 MED ORDER — AMOXICILLIN 500 MG PO CAPS
500.0000 mg | ORAL_CAPSULE | Freq: Once | ORAL | Status: AC
  Administered 2015-12-06: 500 mg via ORAL
  Filled 2015-12-06: qty 1

## 2015-12-06 MED ORDER — IBUPROFEN 800 MG PO TABS: 800.0000 mg | ORAL_TABLET | Freq: Three times a day (TID) | ORAL | 0 refills | Status: DC | PRN

## 2015-12-06 MED ORDER — AZITHROMYCIN 250 MG PO TABS: 250.0000 mg | ORAL_TABLET | Freq: Every day | ORAL | 0 refills | Status: DC

## 2015-12-06 NOTE — Discharge Instructions (Addendum)
Read the information below.  You may return to the Emergency Department at any time for worsening condition or any new symptoms that concern you.   If you develop worsening chest pain, shortness of breath, fever, you pass out, or become weak or dizzy, return to the ER for a recheck.    °

## 2015-12-06 NOTE — ED Provider Notes (Signed)
[facility] DEPT Provider Note   CSN: [id number] Arrival date & time: 12/06/15  1245     History   Chief Complaint Chief Complaint  Patient presents with  . URI  . Diarrhea    HPI Mallory Bailey is a 60 y.o. female.  HPI   Patient presents with right sided chest pain and SOB, cough that began two days ago.  Cough is productive of thick white sputum. The pain is constant, has been gradually worsening, feels like pressure and tightness, exacerbated by movement and deep inspiration.  Has also had nausea and diarrhea.  Does do heavy lifting and works long hours at [employer].  Did "party" and smoke various substances 4 days ago, had no symptoms the following day.  Denies immobilization, hx blood clots, leg swelling or pain, abdominal pain.    Past Medical History:  Diagnosis Date  . Allergy   . Chicken pox     Patient Active Problem List   Diagnosis Date Noted  . Nerve pain 05/04/2014  . Dental abscess 05/04/2014  . Seasonal allergies 05/04/2014    Past Surgical History:  Procedure Laterality Date  . ABDOMINAL HYSTERECTOMY      OB History    No data available       Home Medications    Prior to Admission medications   Medication Sig Start Date End Date Taking? Authorizing Provider  b complex vitamins capsule Take 1 capsule by mouth daily.   Yes Historical Provider, MD  COD LIVER OIL PO Take 1 tablet by mouth daily.    Yes Historical Provider, MD  FIBER FORMULA PO Take 1 tablet by mouth daily.    Yes Historical Provider, MD  Multiple Vitamins-Minerals (MULTIVITAMIN WITH MINERALS) tablet Take 1 tablet by mouth daily.   Yes Historical Provider, MD  Omega-3 Fatty Acids (FISH OIL PO) Take 1 tablet by mouth daily.    Yes Historical Provider, MD  amoxicillin (AMOXIL) 500 MG capsule Take 1 capsule (500 mg total) by mouth 3 (three) times daily. 12/06/15   Clayton Bibles, PA-C  amoxicillin-clavulanate (AUGMENTIN) 875-125 MG per tablet Take 1 tablet by mouth 2 (two) times  daily. Patient not taking: Reported on 12/06/2015 05/04/14   Golden Circle, FNP  azithromycin (ZITHROMAX) 250 MG tablet Take 1 tablet (250 mg total) by mouth daily. Start 12/07/15 12/06/15   Clayton Bibles, PA-C  fluticasone (FLONASE) 50 MCG/ACT nasal spray Place 2 sprays into both nostrils daily. Patient not taking: Reported on 12/06/2015 05/04/14   Golden Circle, FNP  ibuprofen (ADVIL,MOTRIN) 800 MG tablet Take 1 tablet (800 mg total) by mouth every 8 (eight) hours as needed for mild pain or moderate pain. 12/06/15   Clayton Bibles, PA-C  loratadine (CLARITIN) 10 MG tablet Take 1 tablet (10 mg total) by mouth daily. OTC Patient not taking: Reported on 12/06/2015 05/04/14   Golden Circle, FNP  naproxen (NAPROSYN) 500 MG tablet Take 1 tablet (500 mg total) by mouth 2 (two) times daily with a meal. Patient not taking: Reported on 12/06/2015 05/04/14   Golden Circle, FNP    Family History Family History  Problem Relation Age of Onset  . Dementia Mother     Social History Social History  Substance Use Topics  . Smoking status: Current Some Day Smoker    Types: Cigarettes  . Smokeless tobacco: Never Used  . Alcohol use Yes     Comment: occasionally     Allergies   Review of patient's allergies indicates no  known allergies.   Review of Systems Review of Systems  All other systems reviewed and are negative.    Physical Exam Updated Vital Signs BP 141/90 (BP Location: Left Arm)   Pulse 81   Temp 98.5 F (36.9 C) (Oral)   Resp 19   Ht 5\' 6"  (1.676 m)   Wt 70.3 kg   SpO2 98%   BMI 25.02 kg/m   Physical Exam  Constitutional: She appears well-developed and well-nourished. No distress.  HENT:  Head: Normocephalic and atraumatic.  Neck: Neck supple.  Cardiovascular: Normal rate and regular rhythm.   Pulmonary/Chest: Effort normal and breath sounds normal. No respiratory distress. She has no wheezes. She has no rhonchi. She has no rales.  Shallow respirations  Abdominal: Soft. She  exhibits no distension. There is no tenderness. There is no rebound and no guarding.  Musculoskeletal: She exhibits no edema or tenderness.  Neurological: She is alert.  Skin: She is not diaphoretic.  Nursing note and vitals reviewed.    ED Treatments / Results  Labs (all labs ordered are listed, but only abnormal results are displayed) Labs Reviewed  BASIC METABOLIC PANEL - Abnormal; Notable for the following:       Result Value   Chloride 99 (*)    Glucose, Bld 106 (*)    BUN 5 (*)    All other components within normal limits  CBC - Abnormal; Notable for the following:    WBC 15.4 (*)    RBC 5.85 (*)    MCV 76.2 (*)    MCH 24.6 (*)    All other components within normal limits  D-DIMER, QUANTITATIVE (NOT AT [facility]) - Abnormal; Notable for the following:    D-Dimer, Quant 0.64 (*)    All other components within normal limits  RAPID URINE DRUG SCREEN, HOSP PERFORMED - Abnormal; Notable for the following:    Cocaine POSITIVE (*)    All other components within normal limits  LIPASE, BLOOD  HEPATIC FUNCTION PANEL  I-STAT TROPOININ, ED  I-STAT TROPOININ, ED    EKG  EKG Interpretation  Date/Time:  Friday December 06 2015 12:56:12 EDT Ventricular Rate:  89 PR Interval:  132 QRS Duration: 78 QT Interval:  370 QTC Calculation: 450 R Axis:   67 Text Interpretation:  Normal sinus rhythm Right atrial enlargement Borderline ECG No old tracing to compare Confirmed by KNAPP  MD-J, JON ([id number]) on 12/06/2015 5:15:46 PM       Radiology Dg Chest 2 View  Result Date: 12/06/2015 CLINICAL DATA:  Chest pain and cough for 2 days EXAM: CHEST  2 VIEW COMPARISON:  February 05, 2012 FINDINGS: There is bibasilar atelectatic change. Lungs elsewhere clear. Heart size and pulmonary vascularity are normal. No adenopathy. IMPRESSION: Bibasilar atelectasis.  No edema or consolidation. Electronically Signed   By: Lowella Grip III M.D.   On: 12/06/2015 13:15   Ct Angio Chest Pe W And/or Wo  Contrast  Result Date: 12/06/2015 CLINICAL DATA:  RIGHT-sided chest pain for 2 days question pulmonary embolism EXAM: CT ANGIOGRAPHY CHEST WITH CONTRAST TECHNIQUE: Multidetector CT imaging of the chest was performed using the standard protocol during bolus administration of intravenous contrast. Multiplanar CT image reconstructions and MIPs were obtained to evaluate the vascular anatomy. CONTRAST:  80 cc Isovue 370 IV COMPARISON:  None. FINDINGS: Cardiovascular: Aorta normal caliber without aneurysm or dissection. Mild atherosclerotic calcification aortic arch and coronary arteries. Pulmonary arteries well opacified and patent. No evidence of pulmonary embolism. No pericardial effusion. Mediastinum/Nodes: Esophagus  unremarkable. No thoracic adenopathy. Small nodule inferior LEFT thyroid node 12 mm diameter. Lungs/Pleura: RIGHT middle lobe consolidation with air bronchograms. Bibasilar atelectasis with question additional infiltrate LEFT lower lobe. No pleural effusion or pneumothorax. Upper Abdomen: Visualized upper abdomen unremarkable Musculoskeletal: No acute osseous findings Review of the MIP images confirms the above findings. IMPRESSION: No evidence of pulmonary embolism. RIGHT middle lobe consolidation consistent with pneumonia. Scattered atelectasis in both lower lobes with question additional LEFT lower lobe infiltrate. Aortic atherosclerosis and minimal coronary arterial calcification. Electronically Signed   By: Lavonia Dana M.D.   On: 12/06/2015 19:00    Procedures Procedures (including critical care time)  Medications Ordered in ED Medications  albuterol (PROVENTIL HFA;VENTOLIN HFA) 108 (90 Base) MCG/ACT inhaler 2 puff (not administered)  azithromycin (ZITHROMAX) tablet 1,000 mg (not administered)  amoxicillin (AMOXIL) capsule 500 mg (not administered)  ketorolac (TORADOL) 30 MG/ML injection 30 mg (30 mg Intravenous Given 12/06/15 1547)  iopamidol (ISOVUE-370) 76 % injection (80 mLs   Contrast Given 12/06/15 1832)     Initial Impression / Assessment and Plan / ED Course  I have reviewed the triage vital signs and the nursing notes.  Pertinent labs & imaging results that were available during my care of the patient were reviewed by me and considered in my medical decision making (see chart for details).  Clinical Course  Comment By Time  Patient feeling much better.  She is not tachypneic.  She would like to be d/c home.   Clayton Bibles, PA-C 11/03 1921    Nontoxic patient with two days of right sided chest pain with SOB, nausea, diarrhea.  No abdominal pain.  Does do heavy lifting.  Also likely recent sick contacts given work with general public at [employer].  Does smoke crack cocaine, last was 4 days ago.  Labs significant for UDS with cocaine and elevated d-dimer.  CT angio chest demonstrated right sided pneumonia and possible left sided pneumonia as well.  Pt feels complete relief after toradol and zofran, very hungry.   She is breathing easily after pain is controlled with toradol.  Not tachypneic and speaks clearly and calmly in full sentences.  Discussed possibility of admission with the patient and she is adamant about going home.  She is comfortable being d/c home and will follow closely with PCP and return for any worsening symptoms.  Pt has expressed interest in getting help with quitting drugs, will provide resources.  D/C home with azithromycin, amoxicillin, albuterol, motrin.  PCP follow up.  Discussed result, findings, treatment, and follow up  with patient.  Pt given return precautions.  Pt verbalizes understanding and agrees with plan.      Final Clinical Impressions(s) / ED Diagnoses   Final diagnoses:  Right-sided chest pain  Cocaine use  Community acquired pneumonia of right middle lobe of lung (HCC)    New Prescriptions New Prescriptions   AMOXICILLIN (AMOXIL) 500 MG CAPSULE    Take 1 capsule (500 mg total) by mouth 3 (three) times daily.    AZITHROMYCIN (ZITHROMAX) 250 MG TABLET    Take 1 tablet (250 mg total) by mouth daily. Start 12/07/15   IBUPROFEN (ADVIL,MOTRIN) 800 MG TABLET    Take 1 tablet (800 mg total) by mouth every 8 (eight) hours as needed for mild pain or moderate pain.     Clayton Bibles, PA-C 12/06/15 1952    Dorie Rank, MD 12/07/15 [phone]

## 2015-12-06 NOTE — ED Triage Notes (Addendum)
Pt presents with complaint of URI symptoms and pain radiating under R breast and wraps around to R flank, states pain worsens with cough/deep breathing. Pt. States chills at home, nasal/chest congestion x2 days. Pt states she occasionally uses crack cocaine, states last use 11/1.

## 2015-12-16 ENCOUNTER — Ambulatory Visit (INDEPENDENT_AMBULATORY_CARE_PROVIDER_SITE_OTHER): Payer: BLUE CROSS/BLUE SHIELD | Admitting: Family

## 2015-12-16 ENCOUNTER — Encounter: Payer: Self-pay | Admitting: Family

## 2015-12-16 ENCOUNTER — Other Ambulatory Visit (INDEPENDENT_AMBULATORY_CARE_PROVIDER_SITE_OTHER): Payer: BLUE CROSS/BLUE SHIELD

## 2015-12-16 VITALS — BP 148/92 | HR 74 | Temp 98.2°F | Resp 16 | Ht 66.0 in | Wt 155.0 lb

## 2015-12-16 DIAGNOSIS — J189 Pneumonia, unspecified organism: Secondary | ICD-10-CM

## 2015-12-16 DIAGNOSIS — E611 Iron deficiency: Secondary | ICD-10-CM

## 2015-12-16 DIAGNOSIS — Z23 Encounter for immunization: Secondary | ICD-10-CM | POA: Diagnosis not present

## 2015-12-16 LAB — COMPREHENSIVE METABOLIC PANEL
ALBUMIN: 4.1 g/dL (ref 3.5–5.2)
ALT: 16 U/L (ref 0–35)
AST: 29 U/L (ref 0–37)
Alkaline Phosphatase: 117 U/L (ref 39–117)
BUN: 12 mg/dL (ref 6–23)
CHLORIDE: 104 meq/L (ref 96–112)
CO2: 30 meq/L (ref 19–32)
CREATININE: 0.96 mg/dL (ref 0.40–1.20)
Calcium: 9.3 mg/dL (ref 8.4–10.5)
GFR: 75.55 mL/min (ref >60.00)
GLUCOSE: 92 mg/dL (ref 70–99)
POTASSIUM: 4.1 meq/L (ref 3.5–5.1)
SODIUM: 139 meq/L (ref 135–145)
Total Bilirubin: 0.3 mg/dL (ref 0.2–1.2)
Total Protein: 7.3 g/dL (ref 6.0–8.3)

## 2015-12-16 LAB — IBC PANEL
IRON: 75 ug/dL (ref 42–145)
SATURATION RATIOS: 18.3 % — AB (ref 20.0–50.0)
Transferrin: 292 mg/dL (ref 212.0–360.0)

## 2015-12-16 NOTE — Assessment & Plan Note (Signed)
Community-acquired pneumonia appears adequately treated with amoxicillin and azithromycin. Patient instructed to complete remaining amoxicillin. Pulmonary exam is normal. No further treatment is necessary at this time. Follow-up if symptoms return.

## 2015-12-16 NOTE — Patient Instructions (Addendum)
Thank you for choosing [facility].  SUMMARY AND INSTRUCTIONS:  Medication:  Continue to take your medications as prescribed.   Follow up:  Schedule a time for your physical at your convenience.  If your symptoms worsen or fail to improve, please contact our office for further instruction, or in case of emergency go directly to the emergency room at the closest medical facility.

## 2015-12-16 NOTE — Assessment & Plan Note (Signed)
Most recent CBC consistent with microcytosis with no evidence of anemia which is consistent with iron deficiency. Obtain iron panel. Follow-up and additional treatment pending iron panel results.

## 2015-12-16 NOTE — Progress Notes (Signed)
Subjective:    Patient ID: Mallory Bailey, female    DOB: [birth year], 60 y.o.   MRN: [id number]  Chief Complaint  Patient presents with  . Hospitalization Follow-up    drug user has stopped since being out of the hospital, has some liver and kidney damage    HPI:  Mallory Bailey is a 60 y.o. female who  has a past medical history of Allergy and Chicken pox. and presents today for a follow up office visit.   This is a new problem. Recently evaluated in the emergency department with the chief complaint of right-sided chest pain and shortness of breath began prior to arrival. Productive cough is described as producing thick, white sputum. Chest pain is gradually worsening and described as pressure and tightness which was exacerbated by movement and deep inspiration. She was noted to be positive for cocaine upon drug screen. D-dimer was noted to be elevated and follow-up CT was negative for pulmonary embolism and positive for right-sided pneumonia. She was treated with amoxicillin and azithromycin.  Since leaving the ED she reports that she has felt a lot better. Reports taking the Amoxicillin and Azithromycin as prescribed and denies adverse side effects. No fevers, congestion or coughing. Chest pain has completely resolved.  Did test positive for cocaine and indicates she has remained clean since the visit to the ED.     No Known Allergies    Outpatient Medications Prior to Visit  Medication Sig Dispense Refill  . amoxicillin (AMOXIL) 500 MG capsule Take 1 capsule (500 mg total) by mouth 3 (three) times daily. 30 capsule 0  . b complex vitamins capsule Take 1 capsule by mouth daily.    . COD LIVER OIL PO Take 1 tablet by mouth daily.     Marland Kitchen FIBER FORMULA PO Take 1 tablet by mouth daily.     . fluticasone (FLONASE) 50 MCG/ACT nasal spray Place 2 sprays into both nostrils daily. 16 g 3  . ibuprofen (ADVIL,MOTRIN) 800 MG tablet Take 1 tablet (800 mg total) by mouth every 8  (eight) hours as needed for mild pain or moderate pain. 20 tablet 0  . loratadine (CLARITIN) 10 MG tablet Take 1 tablet (10 mg total) by mouth daily. OTC 30 tablet 3  . Multiple Vitamins-Minerals (MULTIVITAMIN WITH MINERALS) tablet Take 1 tablet by mouth daily.    . naproxen (NAPROSYN) 500 MG tablet Take 1 tablet (500 mg total) by mouth 2 (two) times daily with a meal. 30 tablet 0  . Omega-3 Fatty Acids (FISH OIL PO) Take 1 tablet by mouth daily.     Marland Kitchen amoxicillin-clavulanate (AUGMENTIN) 875-125 MG per tablet Take 1 tablet by mouth 2 (two) times daily. 20 tablet 0  . azithromycin (ZITHROMAX) 250 MG tablet Take 1 tablet (250 mg total) by mouth daily. Start 12/07/15 4 tablet 0   No facility-administered medications prior to visit.       Past Surgical History:  Procedure Laterality Date  . ABDOMINAL HYSTERECTOMY        Past Medical History:  Diagnosis Date  . Allergy   . Chicken pox       Review of Systems  Constitutional: Negative for chills and fever.  Respiratory: Negative for chest tightness and shortness of breath.   Cardiovascular: Negative for chest pain, palpitations and leg swelling.  Neurological: Negative for dizziness, weakness and headaches.      Objective:    BP (!) 148/92 (BP Location: Left Arm, Patient Position: Sitting, Cuff Size:  Normal)   Pulse 74   Temp 98.2 F (36.8 C) (Oral)   Resp 16   Ht 5\' 6"  (1.676 m)   Wt 155 lb (70.3 kg)   SpO2 96%   BMI 25.02 kg/m  Nursing note and vital signs reviewed.  Physical Exam  Constitutional: She is oriented to person, place, and time. She appears well-developed and well-nourished. No distress.  Cardiovascular: Normal rate, regular rhythm, normal heart sounds and intact distal pulses.   Pulmonary/Chest: Effort normal and breath sounds normal.  Neurological: She is alert and oriented to person, place, and time.  Skin: Skin is warm and dry.  Psychiatric: She has a normal mood and affect. Her behavior is normal.  Judgment and thought content normal.       Assessment & Plan:   Problem List Items Addressed This Visit      Respiratory   Community acquired pneumonia - Primary    Community-acquired pneumonia appears adequately treated with amoxicillin and azithromycin. Patient instructed to complete remaining amoxicillin. Pulmonary exam is normal. No further treatment is necessary at this time. Follow-up if symptoms return.      Relevant Orders   Comprehensive metabolic panel (Completed)     Other   Iron deficiency    Most recent CBC consistent with microcytosis with no evidence of anemia which is consistent with iron deficiency. Obtain iron panel. Follow-up and additional treatment pending iron panel results.      Relevant Orders   IBC panel (Completed)    Other Visit Diagnoses    Encounter for immunization       Relevant Orders   Flu Vaccine QUAD 36+ mos IM (Completed)       I have discontinued Ms. Canal's amoxicillin-clavulanate and azithromycin. I am also having her maintain her COD LIVER OIL PO, FIBER FORMULA PO, multivitamin with minerals, b complex vitamins, Omega-3 Fatty Acids (FISH OIL PO), loratadine, fluticasone, naproxen, amoxicillin, and ibuprofen.   Follow-up: Return if symptoms worsen or fail to improve.  Mauricio Po, FNP

## 2016-01-20 ENCOUNTER — Encounter: Payer: BLUE CROSS/BLUE SHIELD | Admitting: Family

## 2016-01-20 DIAGNOSIS — Z0289 Encounter for other administrative examinations: Secondary | ICD-10-CM

## 2016-05-31 ENCOUNTER — Emergency Department (HOSPITAL_COMMUNITY): Payer: BLUE CROSS/BLUE SHIELD

## 2016-05-31 ENCOUNTER — Encounter (HOSPITAL_COMMUNITY): Payer: Self-pay | Admitting: Emergency Medicine

## 2016-05-31 ENCOUNTER — Emergency Department (HOSPITAL_COMMUNITY)
Admission: EM | Admit: 2016-05-31 | Discharge: 2016-05-31 | Disposition: A | Discharge status: Home / Self Care | Payer: BLUE CROSS/BLUE SHIELD | Attending: Emergency Medicine | Admitting: Emergency Medicine

## 2016-05-31 DIAGNOSIS — R479 Unspecified speech disturbances: Secondary | ICD-10-CM | POA: Diagnosis not present

## 2016-05-31 DIAGNOSIS — R040 Epistaxis: Secondary | ICD-10-CM | POA: Diagnosis present

## 2016-05-31 DIAGNOSIS — Z79899 Other long term (current) drug therapy: Secondary | ICD-10-CM | POA: Insufficient documentation

## 2016-05-31 DIAGNOSIS — F17210 Nicotine dependence, cigarettes, uncomplicated: Secondary | ICD-10-CM | POA: Diagnosis not present

## 2016-05-31 LAB — DIFFERENTIAL
BASOS ABS: 0 10*3/uL (ref 0.0–0.1)
BASOS PCT: 0 %
EOS ABS: 0.1 10*3/uL (ref 0.0–0.7)
Eosinophils Relative: 2 %
Lymphocytes Relative: 34 %
Lymphs Abs: 2.5 10*3/uL (ref 0.7–4.0)
MONO ABS: 0.6 10*3/uL (ref 0.1–1.0)
MONOS PCT: 8 %
Neutro Abs: 4.1 10*3/uL (ref 1.7–7.7)
Neutrophils Relative %: 56 %

## 2016-05-31 LAB — COMPREHENSIVE METABOLIC PANEL
ALT: 20 U/L (ref 14–54)
AST: 38 U/L (ref 15–41)
Albumin: 3.8 g/dL (ref 3.5–5.0)
Alkaline Phosphatase: 130 U/L — ABNORMAL HIGH (ref 38–126)
Anion gap: 12 (ref 5–15)
BUN: 15 mg/dL (ref 6–20)
CHLORIDE: 104 mmol/L (ref 101–111)
CO2: 22 mmol/L (ref 22–32)
Calcium: 9.1 mg/dL (ref 8.9–10.3)
Creatinine, Ser: 0.92 mg/dL (ref 0.44–1.00)
Glucose, Bld: 92 mg/dL (ref 65–99)
POTASSIUM: 4.1 mmol/L (ref 3.5–5.1)
SODIUM: 138 mmol/L (ref 135–145)
Total Bilirubin: 0.3 mg/dL (ref 0.3–1.2)
Total Protein: 6.8 g/dL (ref 6.5–8.1)

## 2016-05-31 LAB — CBC
HEMATOCRIT: 45.8 % (ref 36.0–46.0)
Hemoglobin: 14.6 g/dL (ref 12.0–15.0)
MCH: 24.4 pg — ABNORMAL LOW (ref 26.0–34.0)
MCHC: 31.9 g/dL (ref 30.0–36.0)
MCV: 76.5 fL — ABNORMAL LOW (ref 78.0–100.0)
PLATELETS: 236 10*3/uL (ref 150–400)
RBC: 5.99 MIL/uL — ABNORMAL HIGH (ref 3.87–5.11)
RDW: 15 % (ref 11.5–15.5)
WBC: 7.3 10*3/uL (ref 4.0–10.5)

## 2016-05-31 LAB — RAPID URINE DRUG SCREEN, HOSP PERFORMED
AMPHETAMINES: NOT DETECTED
BARBITURATES: NOT DETECTED
Benzodiazepines: NOT DETECTED
COCAINE: POSITIVE — AB
Opiates: NOT DETECTED
Tetrahydrocannabinol: NOT DETECTED

## 2016-05-31 LAB — URINALYSIS, ROUTINE W REFLEX MICROSCOPIC
BILIRUBIN URINE: NEGATIVE
GLUCOSE, UA: NEGATIVE mg/dL
HGB URINE DIPSTICK: NEGATIVE
Ketones, ur: NEGATIVE mg/dL
Leukocytes, UA: NEGATIVE
Nitrite: NEGATIVE
PROTEIN: NEGATIVE mg/dL
Specific Gravity, Urine: 1.017 (ref 1.005–1.030)
pH: 7 (ref 5.0–8.0)

## 2016-05-31 LAB — PROTIME-INR
INR: 0.89
Prothrombin Time: 12 seconds (ref 11.4–15.2)

## 2016-05-31 LAB — APTT: APTT: 26 s (ref 24–36)

## 2016-05-31 LAB — ETHANOL: Alcohol, Ethyl (B): <5 mg/dL (ref <5)

## 2016-05-31 LAB — TROPONIN I: Troponin I: <0.03 ng/mL (ref <0.03)

## 2016-05-31 MED ORDER — OXYMETAZOLINE HCL 0.05 % NA SOLN
1.0000 | Freq: Once | NASAL | Status: AC
  Administered 2016-05-31: 1 via NASAL
  Filled 2016-05-31: qty 15

## 2016-05-31 NOTE — ED Notes (Signed)
Doctor at bedside.

## 2016-05-31 NOTE — ED Triage Notes (Signed)
Onset last night 2200 stated trouble getting works out for 30 seconds. Resolved and today woke up at 0500 and at 0530 developed right nostril nose bleed resolved took one tablet aspirin 325mg . While at church nosebleed developed again both nostrils. Airway intact bilateral equal chest rise and fall.

## 2016-05-31 NOTE — ED Notes (Signed)
Nose bleed resolved patient intermittently spitting bright red blood. Airway intact bilateral equal chest rise and fall.

## 2016-05-31 NOTE — ED Provider Notes (Signed)
[facility] DEPT Provider Note   CSN: [id number] Arrival date & time: 05/31/16  0913     History   Chief Complaint Chief Complaint  Patient presents with  . Epistaxis    this morning approx 0630  . Aphasia    last night approx 2200    HPI Mallory Bailey is a 61 y.o. female.  HPI  Pt was seen at 0925. Per pt and her family, c/o gradual onset and persistence of waxing and waning right sided nosebleed that began approximately 0530 this morning. Pt states she tried holding pressure with improvement. Pt states she took ASA 325mg , then went to church. Pt states her nosebleed began again approximately 0630.  Pt also c/o sudden onset and resolution of one episode of "slurred speech" that occurred last night approximately 2200. Pt states she "was having trouble getting words out" while at a donut shop. This lasted approximately 30 seconds before completely resolving. Denies any other symptoms; denies return of symptoms today. Denies visual changes, no focal motor weakness, no tingling/numbness in extremities, no ataxia, no facial droop, no CP/SOB, no abd pain, no N/V/D.    Past Medical History:  Diagnosis Date  . Allergy   . Chicken pox     Patient Active Problem List   Diagnosis Date Noted  . Iron deficiency 12/16/2015  . Community acquired pneumonia 12/16/2015  . Nerve pain 05/04/2014  . Dental abscess 05/04/2014  . Seasonal allergies 05/04/2014    Past Surgical History:  Procedure Laterality Date  . ABDOMINAL HYSTERECTOMY      OB History    No data available       Home Medications    Prior to Admission medications   Medication Sig Start Date End Date Taking? Authorizing Provider  amoxicillin (AMOXIL) 500 MG capsule Take 1 capsule (500 mg total) by mouth 3 (three) times daily. 12/06/15   Clayton Bibles, PA-C  b complex vitamins capsule Take 1 capsule by mouth daily.    Historical Provider, MD  COD LIVER OIL PO Take 1 tablet by mouth daily.     Historical  Provider, MD  FIBER FORMULA PO Take 1 tablet by mouth daily.     Historical Provider, MD  fluticasone (FLONASE) 50 MCG/ACT nasal spray Place 2 sprays into both nostrils daily. 05/04/14   Golden Circle, FNP  ibuprofen (ADVIL,MOTRIN) 800 MG tablet Take 1 tablet (800 mg total) by mouth every 8 (eight) hours as needed for mild pain or moderate pain. 12/06/15   Clayton Bibles, PA-C  loratadine (CLARITIN) 10 MG tablet Take 1 tablet (10 mg total) by mouth daily. OTC 05/04/14   Golden Circle, FNP  Multiple Vitamins-Minerals (MULTIVITAMIN WITH MINERALS) tablet Take 1 tablet by mouth daily.    Historical Provider, MD  naproxen (NAPROSYN) 500 MG tablet Take 1 tablet (500 mg total) by mouth 2 (two) times daily with a meal. 05/04/14   Golden Circle, FNP  Omega-3 Fatty Acids (FISH OIL PO) Take 1 tablet by mouth daily.     Historical Provider, MD    Family History Family History  Problem Relation Age of Onset  . Dementia Mother     Social History Social History  Substance Use Topics  . Smoking status: Current Some Day Smoker    Types: Cigarettes  . Smokeless tobacco: Never Used  . Alcohol use Yes     Comment: occasionally     Allergies   Patient has no known allergies.   Review of Systems Review of  Systems ROS: Statement: All systems negative except as marked or noted in the HPI; Constitutional: Negative for fever and chills. ; ; Eyes: Negative for eye pain, redness and discharge. ; ; ENMT: +nosebleed. Negative for ear pain, hoarseness, nasal congestion, sinus pressure and sore throat. ; ; Cardiovascular: Negative for chest pain, palpitations, diaphoresis, dyspnea and peripheral edema. ; ; Respiratory: Negative for cough, wheezing and stridor. ; ; Gastrointestinal: Negative for nausea, vomiting, diarrhea, abdominal pain, blood in stool, hematemesis, jaundice and rectal bleeding. . ; ; Genitourinary: Negative for dysuria, flank pain and hematuria. ; ; Musculoskeletal: Negative for back pain and neck  pain. Negative for swelling and trauma.; ; Skin: Negative for pruritus, rash, abrasions, blisters, bruising and skin lesion.; ; Neuro: +speech change. Negative for headache, lightheadedness and neck stiffness. Negative for weakness, altered level of consciousness, altered mental status, extremity weakness, paresthesias, involuntary movement, seizure and syncope.       Physical Exam Updated Vital Signs BP (!) 144/97 (BP Location: Right Arm)   Pulse 72   Temp 97.9 F (36.6 C)   Resp 18   Ht 5\' 6"  (1.676 m)   Wt 154 lb (69.9 kg)   SpO2 99%   BMI 24.86 kg/m    BP (!) 147/92   Pulse 64   Temp 97.9 F (36.6 C)   Resp 18   Ht 5\' 6"  (1.676 m)   Wt 154 lb (69.9 kg)   SpO2 98%   BMI 24.86 kg/m    Physical Exam 0930: Physical examination:  Nursing notes reviewed; Vital signs and O2 SAT reviewed;  Constitutional: Well developed, Well nourished, Well hydrated, In no acute distress; Head:  Normocephalic, atraumatic; Eyes: EOMI, PERRL, No scleral icterus; ENMT: TM's clear bilat. Mouth and pharynx normal, Mucous membranes moist. Pt holding pressure to bilat nares.; Neck: Supple, Full range of motion, No lymphadenopathy; Cardiovascular: Regular rate and rhythm, No gallop; Respiratory: Breath sounds clear & equal bilaterally, No wheezes.  Speaking full sentences with ease, Normal respiratory effort/excursion; Chest: Nontender, Movement normal; Abdomen: Soft, Nontender, Nondistended, Normal bowel sounds; Genitourinary: No CVA tenderness; Extremities: Pulses normal, No tenderness, No edema, No calf edema or asymmetry.; Neuro: AA&Ox3, Major CN grossly intact. No facial droop. Speech clear. Pt moves all extremities spontaneously and to command without apparent gross focal motor or sensory deficits. Climbs on and off stretcher easily by herself. Gait steady.; Skin: Color normal, Warm, Dry.   ED Treatments / Results  Labs (all labs ordered are listed, but only abnormal results are displayed)   EKG   EKG Interpretation  Date/Time:  Sunday May 31 2016 09:58:30 EDT Ventricular Rate:  68 PR Interval:    QRS Duration: 91 QT Interval:  424 QTC Calculation: 451 R Axis:   68 Text Interpretation:  Sinus rhythm Probable left atrial enlargement Baseline wander When compared with ECG of 12/06/2015 No significant change was found Confirmed by [facility]  MD, Nunzio Cory [phone]) on 05/31/2016 10:08:15 AM       Radiology   Procedures Procedures (including critical care time)   0930:  ENT/Dental procedure:  Right nares epistaxis: pt gently blew nose with multiple large and small clots evacuated, bilat nares suctioned, Afrin sprayed bilat nares, firm pressure held.  3818:  Epistaxis resolved.         Medications Ordered in ED Medications  oxymetazoline (AFRIN) 0.05 % nasal spray 1 spray (not administered)     Initial Impression / Assessment and Plan / ED Course  I have reviewed the triage vital  signs and the nursing notes.  Pertinent labs & imaging results that were available during my care of the patient were reviewed by me and considered in my medical decision making (see chart for details).  MDM Reviewed: previous chart, nursing note and vitals Reviewed previous: labs and ECG Interpretation: labs, ECG and MRI   Results for orders placed or performed during the hospital encounter of 05/31/16  Ethanol  Result Value Ref Range   Alcohol, Ethyl (B) <5 <5 mg/dL  Protime-INR  Result Value Ref Range   Prothrombin Time 12.0 11.4 - 15.2 seconds   INR 0.89   APTT  Result Value Ref Range   aPTT 26 24 - 36 seconds  CBC  Result Value Ref Range   WBC 7.3 4.0 - 10.5 K/uL   RBC 5.99 (H) 3.87 - 5.11 MIL/uL   Hemoglobin 14.6 12.0 - 15.0 g/dL   HCT 45.8 36.0 - 46.0 %   MCV 76.5 (L) 78.0 - 100.0 fL   MCH 24.4 (L) 26.0 - 34.0 pg   MCHC 31.9 30.0 - 36.0 g/dL   RDW 15.0 11.5 - 15.5 %   Platelets 236 150 - 400 K/uL  Differential  Result Value Ref Range   Neutrophils Relative % 56 %    Neutro Abs 4.1 1.7 - 7.7 K/uL   Lymphocytes Relative 34 %   Lymphs Abs 2.5 0.7 - 4.0 K/uL   Monocytes Relative 8 %   Monocytes Absolute 0.6 0.1 - 1.0 K/uL   Eosinophils Relative 2 %   Eosinophils Absolute 0.1 0.0 - 0.7 K/uL   Basophils Relative 0 %   Basophils Absolute 0.0 0.0 - 0.1 K/uL  Comprehensive metabolic panel  Result Value Ref Range   Sodium 138 135 - 145 mmol/L   Potassium 4.1 3.5 - 5.1 mmol/L   Chloride 104 101 - 111 mmol/L   CO2 22 22 - 32 mmol/L   Glucose, Bld 92 65 - 99 mg/dL   BUN 15 6 - 20 mg/dL   Creatinine, Ser 0.92 0.44 - 1.00 mg/dL   Calcium 9.1 8.9 - 10.3 mg/dL   Total Protein 6.8 6.5 - 8.1 g/dL   Albumin 3.8 3.5 - 5.0 g/dL   AST 38 15 - 41 U/L   ALT 20 14 - 54 U/L   Alkaline Phosphatase 130 (H) 38 - 126 U/L   Total Bilirubin 0.3 0.3 - 1.2 mg/dL   GFR calc non Af Amer >60 >60 mL/min   GFR calc Af Amer >60 >60 mL/min   Anion gap 12 5 - 15  Troponin I  Result Value Ref Range   Troponin I <0.03 <0.03 ng/mL   Mr Brain Wo Contrast (neuro Protocol) Result Date: 05/31/2016 CLINICAL DATA:  Speech difficulty occurring yesterday. Nose bleed today. History of smoking crack cocaine. EXAM: MRI HEAD WITHOUT CONTRAST TECHNIQUE: Multiplanar, multiecho pulse sequences of the brain and surrounding structures were obtained without intravenous contrast. COMPARISON:  None. FINDINGS: Brain: No evidence for acute infarction, mass lesion, hydrocephalus, or extra-axial fluid. Slight premature for age atrophy. Moderately advanced T2 and FLAIR hyperintensities throughout the periventricular and subcortical white matter, also involving the brainstem, likely chronic microvascular ischemic change. Scattered small foci of susceptibility on gradient imaging, RIGHT optic radiation and LEFT centrum semiovale periventricular white matter, could represent sequelae of hypertensive cerebrovascular disease and/or illicit drug use. Vascular: Normal flow voids. Skull and upper cervical spine: Normal  marrow signal. Sinuses/Orbits: Negative. Other: There is poor visualization of the structures in the region of  the maxillary sinuses, and nasal cavity, due to dental hardware. IMPRESSION: Slight premature atrophy. Moderately advanced white matter signal abnormality, likely chronic microvascular ischemic change. Small foci of chronic hemorrhage in the deep white matter, likely sequelae of hypertensive cerebrovascular disease and/or illicit drug use. No acute findings are observed on today's examination. Electronically Signed   By: Staci Righter M.D.   On: 05/31/2016 12:47     1325:   T/C to Neuro Dr. Leonel Ramsay, case discussed, including:  HPI, pertinent PM/SHx, VS/PE, dx testing, ED course and treatment:  DDx for pt's speech issue last night is broad and not necessarily due to TIA; after epistaxis resolved, start ASA 81mg  qdaily, f/u PMD regarding risk factor control (ie: BP, lipids), pt does not need to be admitted for this.   1400:  Pt just gave urine sample. Pt does not want to wait for results. States she "is ready to leave now." Dx and testing, as well as d/w Neuro MD, d/w pt.  Questions answered.  Verb understanding. Again encouraged pt to stay for UA results to r/o infection. Pt refuses.  Pt and family informed re: dx testing results not completed, and I recommend her to stay for results.  Pt refuses.  I encouraged pt to stay, continues to refuse.  Pt makes her own medical decisions.  Risks of AMA explained to pt, including, but not limited to:  Sepsis, UTI, stroke, heart attack, cardiac arrythmia ("irregular heart rate/beat"), "passing out," temporary and/or permanent disability, death.  Pt verb understanding and continues to refuse to stay for UA results, understanding the consequences of her decision.  I encouraged pt to follow up with her PMD tomorrow and return to the ED immediately if symptoms return, or for any other concerns.  Pt verb understanding, agreeable.    Final Clinical  Impressions(s) / ED Diagnoses   Final diagnoses:  None    New Prescriptions New Prescriptions   No medications on file     Francine Graven, DO 06/03/16 1948

## 2016-05-31 NOTE — ED Notes (Signed)
Returned from MRI 

## 2016-05-31 NOTE — ED Notes (Signed)
Patient ambulated steady gait and without incident.

## 2016-05-31 NOTE — ED Notes (Signed)
Patient spoke with Doctor and Nurse regarding leaving before urine test results. Doctor and nurse explained will have to sign Against Medical Advice and said I understand and has no urine complaints and will follow up with Primary Doctor.

## 2016-05-31 NOTE — Discharge Instructions (Signed)
Take your usual prescriptions as previously directed.  Start to take a "baby aspirin" by mouth daily (aspirin 81mg ).  If you have a nosebleed:  Blow your nose gently, then lean your head forward and pinch both of your nostrils firmly and continuously for at least 15 to 20 minutes. Take your blood pressure only once per day, either in the morning or in the evening before you go to bed, a few times per week.  Always sit quietly for at least 15 minutes before taking your blood pressure.  Keep a diary of your blood pressures to show your doctor at your follow up office visit. Your regular medical doctor will also need to check your fasting lipids/cholesterol. Call your regular medical doctor tomorrow to schedule a follow up appointment in the next 2 days.  Return to the Emergency Department immediately sooner if worsening.

## 2017-02-02 HISTORY — PX: CATARACT EXTRACTION, BILATERAL: SHX1313

## 2018-08-29 ENCOUNTER — Encounter: Payer: Self-pay | Admitting: Gastroenterology

## 2018-09-08 ENCOUNTER — Ambulatory Visit (AMBULATORY_SURGERY_CENTER): Payer: Self-pay | Admitting: *Deleted

## 2018-09-08 ENCOUNTER — Other Ambulatory Visit: Payer: Self-pay

## 2018-09-08 VITALS — Temp 96.4°F | Ht 65.5 in | Wt 155.6 lb

## 2018-09-08 DIAGNOSIS — Z1211 Encounter for screening for malignant neoplasm of colon: Secondary | ICD-10-CM

## 2018-09-08 NOTE — Progress Notes (Signed)
No egg or soy allergy known to patient  No issues with past sedation with any surgeries  or procedures, no intubation problems  No diet pills per patient No home 02 use per patient  No blood thinners per patient  Pt states  issues with constipation recently - uses OTC liquid PRN- stols are hard and soft both  No A fib or A flutter  EMMI video sent to pt's e mail   In person PV today- RS colon date with pt in PV today

## 2018-09-21 ENCOUNTER — Encounter: Payer: Self-pay | Admitting: Gastroenterology

## 2018-09-23 ENCOUNTER — Encounter: Payer: Self-pay | Admitting: Gastroenterology

## 2018-09-23 ENCOUNTER — Telehealth: Payer: Self-pay

## 2018-09-23 ENCOUNTER — Ambulatory Visit (AMBULATORY_SURGERY_CENTER): Payer: Medicare Other | Admitting: Gastroenterology

## 2018-09-23 ENCOUNTER — Other Ambulatory Visit: Payer: Self-pay

## 2018-09-23 VITALS — BP 130/72 | HR 61 | Temp 97.8°F | Resp 19 | Ht 65.0 in | Wt 155.0 lb

## 2018-09-23 DIAGNOSIS — D123 Benign neoplasm of transverse colon: Secondary | ICD-10-CM | POA: Diagnosis not present

## 2018-09-23 DIAGNOSIS — Z1211 Encounter for screening for malignant neoplasm of colon: Secondary | ICD-10-CM

## 2018-09-23 DIAGNOSIS — D122 Benign neoplasm of ascending colon: Secondary | ICD-10-CM

## 2018-09-23 MED ORDER — SODIUM CHLORIDE 0.9 % IV SOLN: 500.0000 mL | Freq: Once | INTRAVENOUS | Status: DC

## 2018-09-23 NOTE — Telephone Encounter (Signed)
Covid-19 screening questions   Do you now or have you had a fever in the last 14 days? NO  Do you have any respiratory symptoms of shortness of breath or cough now or in the last 14 days? NO  Do you have any family members or close contacts with diagnosed or suspected Covid-19 in the past 14 days? NO  Have you been tested for Covid-19 and found to be positive? NO     Confirmed with patient   

## 2018-09-23 NOTE — Op Note (Addendum)
[facility] Patient Name: Mallory Bailey Procedure Date: 09/23/2018 10:35 AM MRN: [id number] Endoscopist: Thornton Park MD, MD Age: 63 Referring MD:  Date of Birth: 1955/03/07 Gender: Female Account #: [id number] Procedure:                Colonoscopy Indications:              Screening for colorectal malignant neoplasm, This                            is the patient's first colonoscopy                           No known family history of colon cancer or polyps.                           No baseline GI symptoms. Medicines:                See the Anesthesia note for documentation of the                            administered medications Procedure:                Pre-Anesthesia Assessment:                           - Prior to the procedure, a History and Physical                            was performed, and patient medications and                            allergies were reviewed. The patient's tolerance of                            previous anesthesia was also reviewed. The risks                            and benefits of the procedure and the sedation                            options and risks were discussed with the patient.                            All questions were answered, and informed consent                            was obtained. Prior Anticoagulants: The patient has                            taken no previous anticoagulant or antiplatelet                            agents. ASA Grade Assessment: II - A patient with  mild systemic disease. After reviewing the risks                            and benefits, the patient was deemed in                            satisfactory condition to undergo the procedure.                           After obtaining informed consent, the colonoscope                            was passed under direct vision. Throughout the                            procedure, the patient's blood pressure, pulse,  and                            oxygen saturations were monitored continuously. The                            Colonoscope was introduced through the anus and                            advanced to the the terminal ileum, with                            identification of the appendiceal orifice and IC                            valve. A second forward view of the right colon was                            performed. The colonoscopy was performed without                            difficulty. The patient tolerated the procedure                            well. The quality of the bowel preparation was                            good. The terminal ileum, ileocecal valve,                            appendiceal orifice, and rectum were photographed. Scope In: 10:51:54 AM Scope Out: 11:10:20 AM Scope Withdrawal Time: 0 hours 12 minutes 8 seconds  Total Procedure Duration: 0 hours 18 minutes 26 seconds  Findings:                 The perianal and digital rectal examinations were                            normal.  Non-bleeding internal hemorrhoids were found. The                            hemorrhoids were small.                           A few small and large-mouthed diverticula were                            found in the sigmoid colon and descending colon.                            There was no evidence for diverticulitis.                           Two sessile polyps were found in the ascending                            colon. The polyps were 4 to 10 mm in size. These                            polyps were removed with a cold snare. Resection                            and retrieval were complete. Estimated blood loss                            was minimal.                           A less than 1 mm polyp was found in the ascending                            colon. The polyp was sessile. The polyp was removed                            with a cold biopsy forceps.  Resection and retrieval                            were complete. Estimated blood loss was minimal.                           A 2 mm polyp was found in the hepatic flexure. The                            polyp was sessile. The polyp was removed with a                            cold snare. Resection and retrieval were complete.                            Estimated blood loss was minimal.  The exam was otherwise without abnormality on                            direct and retroflexion views. Complications:            No immediate complications. Estimated blood loss:                            Minimal. Estimated Blood Loss:     Estimated blood loss was minimal. Impression:               - Non-bleeding internal hemorrhoids.                           - Diverticulosis in the sigmoid colon and in the                            descending colon.                           - Two 4 to 10 mm polyps in the ascending colon,                            removed with a cold snare. Resected and retrieved.                           - One less than 1 mm polyp in the ascending colon,                            removed with a cold biopsy forceps. Resected and                            retrieved.                           - One 2 mm polyp at the hepatic flexure, removed                            with a cold snare. Resected and retrieved.                           - The examination was otherwise normal on direct                            and retroflexion views. Recommendation:           - Patient has a contact number available for                            emergencies. The signs and symptoms of potential                            delayed complications were discussed with the                            patient. Return  to normal activities tomorrow.                            Written discharge instructions were provided to the                            patient.                            - Resume previous diet today. High fiber diet                            encouraged.                           - Continue present medications.                           - Await pathology results.                           - Repeat colonoscopy date to be determined after                            pending pathology results are reviewed for                            surveillance based on pathology results. Thornton Park MD, MD 09/23/2018 11:19:51 AM This report has been signed electronically.

## 2018-09-23 NOTE — Progress Notes (Signed)
Pt's states no medical or surgical changes since previsit or office visit. 

## 2018-09-23 NOTE — Patient Instructions (Signed)
Read all of the handouts given to you by your recovery room nurse.  Thank-you for choosing us for your healthcare needs today.  YOU HAD AN ENDOSCOPIC PROCEDURE TODAY AT THE [facility]:   Refer to the procedure report that was given to you for any specific questions about what was found during the examination.  If the procedure report does not answer your questions, please call your gastroenterologist to clarify.  If you requested that your care partner not be given the details of your procedure findings, then the procedure report has been included in a sealed envelope for you to review at your convenience later.  YOU SHOULD EXPECT: Some feelings of bloating in the abdomen. Passage of more gas than usual.  Walking can help get rid of the air that was put into your GI tract during the procedure and reduce the bloating. If you had a lower endoscopy (such as a colonoscopy or flexible sigmoidoscopy) you may notice spotting of blood in your stool or on the toilet paper. If you underwent a bowel prep for your procedure, you may not have a normal bowel movement for a few days.  Please Note:  You might notice some irritation and congestion in your nose or some drainage.  This is from the oxygen used during your procedure.  There is no need for concern and it should clear up in a day or so.  SYMPTOMS TO REPORT IMMEDIATELY:   Following lower endoscopy (colonoscopy or flexible sigmoidoscopy):  Excessive amounts of blood in the stool  Significant tenderness or worsening of abdominal pains  Swelling of the abdomen that is new, acute  Fever of 100F or higher   For urgent or emergent issues, a gastroenterologist can be reached at any hour by calling [phone].   DIET:  We do recommend a small meal at first, but then you may proceed to your regular diet.  Drink plenty of fluids but you should avoid alcoholic beverages for 24 hours. Try to increase the fiber in your diet, and drink plenty of  water.  ACTIVITY:  You should plan to take it easy for the rest of today and you should NOT DRIVE or use heavy machinery until tomorrow (because of the sedation medicines used during the test).    FOLLOW UP: Our staff will call the number listed on your records 48-72 hours following your procedure to check on you and address any questions or concerns that you may have regarding the information given to you following your procedure. If we do not reach you, we will leave a message.  We will attempt to reach you two times.  During this call, we will ask if you have developed any symptoms of COVID 19. If you develop any symptoms (ie: fever, flu-like symptoms, shortness of breath, cough etc.) before then, please call [phone].  If you test positive for Covid 19 in the 2 weeks post procedure, please call and report this information to us.    If any biopsies were taken you will be contacted by phone or by letter within the next 1-3 weeks.  Please call us at [phone] if you have not heard about the biopsies in 3 weeks.    SIGNATURES/CONFIDENTIALITY: You and/or your care partner have signed paperwork which will be entered into your electronic medical record.  These signatures attest to the fact that that the information above on your After Visit Summary has been reviewed and is understood.  Full responsibility of the confidentiality   confidentiality of this discharge information lies with you and/or your care-partner. 

## 2018-09-23 NOTE — Progress Notes (Signed)
Temp per June VS per Courtney 

## 2018-09-23 NOTE — Progress Notes (Signed)
Report to PACU, RN, vss, BBS= Clear.  

## 2018-09-27 ENCOUNTER — Telehealth: Payer: Self-pay | Admitting: *Deleted

## 2018-09-27 NOTE — Telephone Encounter (Signed)
  Follow up Call-  Call back number 09/23/2018  Post procedure Call Back phone  # [phone]  Permission to leave phone message Yes  Some recent data might be hidden     Patient questions:  Do you have a fever, pain , or abdominal swelling? No. Pain Score  0 *  Have you tolerated food without any problems? Yes.    Have you been able to return to your normal activities? Yes.    Do you have any questions about your discharge instructions: Diet   No. Medications  No. Follow up visit  No.  Do you have questions or concerns about your Care? No.  Actions: * If pain score is 4 or above: No action needed, pain <4.  1. Have you developed a fever since your procedure? no  2.   Have you had an respiratory symptoms (SOB or cough) since your procedure? no  3.   Have you tested positive for COVID 19 since your procedure no  4.   Have you had any family members/close contacts diagnosed with the COVID 19 since your procedure?  no   If yes to any of these questions please route to Joylene John, RN and Alphonsa Gin, Therapist, sports.

## 2018-09-27 NOTE — Telephone Encounter (Signed)
First attempt, left VM.  

## 2018-09-28 ENCOUNTER — Encounter: Payer: Self-pay | Admitting: Gastroenterology

## 2018-09-28 ENCOUNTER — Encounter: Payer: BLUE CROSS/BLUE SHIELD | Admitting: Gastroenterology

## 2018-10-28 ENCOUNTER — Encounter: Payer: Self-pay | Admitting: Internal Medicine

## 2018-11-20 ENCOUNTER — Emergency Department (HOSPITAL_COMMUNITY)
Admission: EM | Admit: 2018-11-20 | Discharge: 2018-11-20 | Disposition: A | Discharge status: Home / Self Care | Payer: Medicare Other | Attending: Emergency Medicine | Admitting: Emergency Medicine

## 2018-11-20 ENCOUNTER — Encounter (HOSPITAL_COMMUNITY): Payer: Self-pay | Admitting: Emergency Medicine

## 2018-11-20 ENCOUNTER — Other Ambulatory Visit: Payer: Self-pay

## 2018-11-20 DIAGNOSIS — H5710 Ocular pain, unspecified eye: Secondary | ICD-10-CM | POA: Diagnosis present

## 2018-11-20 DIAGNOSIS — F17210 Nicotine dependence, cigarettes, uncomplicated: Secondary | ICD-10-CM | POA: Insufficient documentation

## 2018-11-20 DIAGNOSIS — I10 Essential (primary) hypertension: Secondary | ICD-10-CM | POA: Insufficient documentation

## 2018-11-20 NOTE — Discharge Instructions (Signed)
Have your Physician recheck your blood pressure

## 2018-11-20 NOTE — ED Provider Notes (Signed)
[facility] EMERGENCY DEPARTMENT Provider Note   CSN: [id number] Arrival date & time: 11/20/18  1056     History   Chief Complaint Chief Complaint  Patient presents with  . Cough    HPI Mallory Bailey is a 63 y.o. female.     The history is provided by the patient. No language interpreter was used.  Conjunctivitis This is a new problem. The current episode started 2 days ago. The problem has been resolved. Nothing aggravates the symptoms. Nothing relieves the symptoms. She has tried nothing for the symptoms. The treatment provided no relief.  Pt reports she had eye irritation at work.  Pt reports she was told she had to have a note to return to work.  Pt states her eye is fine.  She feels like her job overreacted because of covid.  Pt does not feel that she needs covid test.  Pt is requesting a note that says she does not have pink eye and can go back to work  Past Medical History:  Diagnosis Date  . Allergy   . Cataract    forming   . Chicken pox   . Insomnia   . 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 Iron deficiency 12/16/2015  . Community acquired pneumonia 12/16/2015  . Nerve pain 05/04/2014  . Dental abscess 05/04/2014  . Seasonal allergies 05/04/2014    Past Surgical History:  Procedure Laterality Date  . ABDOMINAL HYSTERECTOMY       OB History   No obstetric history on file.      Home Medications    Prior to Admission medications   Medication Sig Start Date End Date Taking? Authorizing Provider  FIBER FORMULA PO Take 1 tablet by mouth daily.     [provider]  ibuprofen (ADVIL,MOTRIN) 800 MG tablet Take 1 tablet (800 mg total) by mouth every 8 (eight) hours as needed for mild pain or moderate pain. 12/06/15   Clayton Bibles, PA-C  Multiple Vitamins-Minerals (MULTIVITAMIN WITH MINERALS) tablet Take 1 tablet by mouth daily.     [provider]    Family History Family History  Problem Relation Age of Onset  . Dementia Mother   . Stomach cancer Sister   . Colon cancer Neg Hx   . Colon polyps Neg Hx   . Esophageal cancer Neg Hx   . Rectal cancer Neg Hx     Social History Social History   Tobacco Use  . Smoking status: Current Some Day Smoker    Types: Cigarettes  . Smokeless tobacco: Never Used  . Tobacco comment: it depends on the situation- none in the last week   Substance Use Topics  . Alcohol use: Yes    Comment: occasionally  . Drug use: Yes    Types: Cocaine    Comment: last 2 weeks      Allergies   Patient has no known allergies.   Review of Systems Review of Systems  All other systems reviewed and are negative.    Physical Exam Updated Vital Signs BP (!) 169/99   Pulse 68   Temp (!) 97.5 F (36.4 C) (Oral)   Resp 16   SpO2 100%   Physical Exam Vitals signs and nursing note reviewed.  Constitutional:      Appearance: She is well-developed.  HENT:  Head: Normocephalic and atraumatic.     Right Ear: Tympanic membrane normal.     Left Ear: Tympanic membrane normal.     Mouth/Throat:     Mouth: Mucous membranes are moist.  Eyes:     Extraocular Movements: Extraocular movements intact.     Conjunctiva/sclera: Conjunctivae normal.     Pupils: Pupils are equal, round, and reactive to light.  Neck:     Musculoskeletal: Normal range of motion.  Cardiovascular:     Rate and Rhythm: Normal rate.  Pulmonary:     Effort: Pulmonary effort is normal.  Abdominal:     General: There is no distension.  Musculoskeletal: Normal range of motion.  Neurological:     Mental Status: She is alert and oriented to person, place, and time.  Psychiatric:        Mood and Affect: Mood normal.      ED Treatments / Results  Labs (all labs ordered are listed, but only abnormal results are displayed) Labs Reviewed - No data to display  EKG None  Radiology No results  found.  Procedures Procedures (including critical care time)  Medications Ordered in ED Medications - No data to display   Initial Impression / Assessment and Plan / ED Course  I have reviewed the triage vital signs and the nursing notes.  Pertinent labs & imaging results that were available during my care of the patient were reviewed by me and considered in my medical decision making (see chart for details).        MDM  No fever, conjunctiva normal.  Lungs clear   Final Clinical Impressions(s) / ED Diagnoses   Final diagnoses:  Hypertension, unspecified type    ED Discharge Orders    None    An After Visit Summary was printed and given to the patient.    Fransico Meadow, [place] 11/20/18 1545    Wyvonnia Dusky, MD 11/20/18 [phone]

## 2018-11-20 NOTE — ED Triage Notes (Signed)
Pt reports having red eyes and cough. States her work wants her to have a Psychologist, sport and exercise of health before returning.

## 2019-03-25 ENCOUNTER — Ambulatory Visit: Payer: Medicare Other | Attending: Internal Medicine

## 2019-03-25 DIAGNOSIS — Z23 Encounter for immunization: Secondary | ICD-10-CM

## 2019-03-25 NOTE — Progress Notes (Signed)
   Covid-19 Vaccination Clinic  Name:  Mallory Bailey    MRN: [id number] DOB: September 09, 1955  03/25/2019  Mallory Bailey was observed post Covid-19 immunization for 15 minutes without incidence. She was provided with Vaccine Information Sheet and instruction to access the V-Safe system.   Mallory Bailey was instructed to call 911 with any severe reactions post vaccine: Marland Kitchen Difficulty breathing  . Swelling of your face and throat  . A fast heartbeat  . A bad rash all over your body  . Dizziness and weakness    Immunizations Administered    Name Date Dose VIS Date Route   Pfizer COVID-19 Vaccine 03/25/2019  2:36 PM 0.3 mL 01/13/2019 Intramuscular   Manufacturer: [place]   Lot: [id number]   [place]: [id number]

## 2019-03-28 ENCOUNTER — Encounter: Payer: Self-pay | Admitting: Certified Registered Nurse Anesthetist

## 2019-03-28 NOTE — Progress Notes (Unsigned)
Covid Immunization record. Lot# incorrect.  [id number], should be [id number]. Correction made. HL

## 2019-04-19 ENCOUNTER — Ambulatory Visit: Payer: Medicare Other | Attending: Internal Medicine

## 2019-04-19 DIAGNOSIS — Z23 Encounter for immunization: Secondary | ICD-10-CM

## 2019-04-19 NOTE — Progress Notes (Signed)
   Covid-19 Vaccination Clinic  Name:  Mallory Bailey    MRN: [id number] DOB: Feb 22, 1955  04/19/2019  [name] was observed post Covid-19 immunization for 15 minutes without incident. She was provided with Vaccine Information Sheet and instruction to access the V-Safe system.   [name] was instructed to call 911 with any severe reactions post vaccine: Marland Kitchen Difficulty breathing  . Swelling of face and throat  . A fast heartbeat  . A bad rash all over body  . Dizziness and weakness   Immunizations Administered    Name Date Dose VIS Date Route   Pfizer COVID-19 Vaccine 04/19/2019  8:21 AM 0.3 mL 01/13/2019 Intramuscular   Manufacturer: [place]   Lot: [id number]   [place]: [id number]

## 2020-03-08 ENCOUNTER — Other Ambulatory Visit: Payer: Self-pay | Admitting: Internal Medicine

## 2020-03-09 LAB — COMPLETE METABOLIC PANEL WITH GFR
AG Ratio: 1.4 (calc) (ref 1.0–2.5)
ALT: 12 U/L (ref 6–29)
AST: 28 U/L (ref 10–35)
Albumin: 3.9 g/dL (ref 3.6–5.1)
Alkaline phosphatase (APISO): 130 U/L (ref 37–153)
BUN/Creatinine Ratio: 15 (calc) (ref 6–22)
BUN: 15 mg/dL (ref 7–25)
CO2: 24 mmol/L (ref 20–32)
Calcium: 9 mg/dL (ref 8.6–10.4)
Chloride: 105 mmol/L (ref 98–110)
Creat: 1.01 mg/dL — ABNORMAL HIGH (ref 0.50–0.99)
GFR, Est African American: 67 mL/min/{1.73_m2} (ref >=60)
GFR, Est Non African American: 58 mL/min/{1.73_m2} — ABNORMAL LOW (ref >=60)
Globulin: 2.7 g/dL (calc) (ref 1.9–3.7)
Glucose, Bld: 99 mg/dL (ref 65–99)
Potassium: 3.6 mmol/L (ref 3.5–5.3)
Sodium: 138 mmol/L (ref 135–146)
Total Bilirubin: 0.4 mg/dL (ref 0.2–1.2)
Total Protein: 6.6 g/dL (ref 6.1–8.1)

## 2020-03-09 LAB — LIPID PANEL
Cholesterol: 138 mg/dL (ref <200)
HDL: 75 mg/dL (ref >=50)
LDL Cholesterol (Calc): 47 mg/dL (calc)
Non-HDL Cholesterol (Calc): 63 mg/dL (calc) (ref <130)
Total CHOL/HDL Ratio: 1.8 (calc) (ref <5.0)
Triglycerides: 84 mg/dL (ref <150)

## 2020-03-09 LAB — CBC
HCT: 42.7 % (ref 35.0–45.0)
Hemoglobin: 13.5 g/dL (ref 11.7–15.5)
MCH: 24.2 pg — ABNORMAL LOW (ref 27.0–33.0)
MCHC: 31.6 g/dL — ABNORMAL LOW (ref 32.0–36.0)
MCV: 76.7 fL — ABNORMAL LOW (ref 80.0–100.0)
MPV: 9.5 fL (ref 7.5–12.5)
Platelets: 274 10*3/uL (ref 140–400)
RBC: 5.57 10*6/uL — ABNORMAL HIGH (ref 3.80–5.10)
RDW: 14.1 % (ref 11.0–15.0)
WBC: 7.4 10*3/uL (ref 3.8–10.8)

## 2020-03-09 LAB — VITAMIN D 25 HYDROXY (VIT D DEFICIENCY, FRACTURES): Vit D, 25-Hydroxy: 36 ng/mL (ref 30–100)

## 2020-03-09 LAB — TSH: TSH: 0.75 mIU/L (ref 0.40–4.50)

## 2020-10-18 ENCOUNTER — Ambulatory Visit (HOSPITAL_COMMUNITY)
Admission: EM | Admit: 2020-10-18 | Discharge: 2020-10-18 | Disposition: A | Discharge status: Home / Self Care | Payer: Medicare HMO | Attending: Emergency Medicine | Admitting: Emergency Medicine

## 2020-10-18 ENCOUNTER — Other Ambulatory Visit: Payer: Self-pay

## 2020-10-18 ENCOUNTER — Telehealth (HOSPITAL_COMMUNITY): Payer: Self-pay | Admitting: Emergency Medicine

## 2020-10-18 ENCOUNTER — Encounter (HOSPITAL_COMMUNITY): Payer: Self-pay | Admitting: Emergency Medicine

## 2020-10-18 DIAGNOSIS — K0510 Chronic gingivitis, plaque induced: Secondary | ICD-10-CM

## 2020-10-18 DIAGNOSIS — K029 Dental caries, unspecified: Secondary | ICD-10-CM

## 2020-10-18 DIAGNOSIS — K047 Periapical abscess without sinus: Secondary | ICD-10-CM | POA: Diagnosis not present

## 2020-10-18 DIAGNOSIS — K0889 Other specified disorders of teeth and supporting structures: Secondary | ICD-10-CM | POA: Diagnosis not present

## 2020-10-18 DIAGNOSIS — S025XXB Fracture of tooth (traumatic), initial encounter for open fracture: Secondary | ICD-10-CM | POA: Diagnosis not present

## 2020-10-18 MED ORDER — IBUPROFEN 800 MG PO TABS: 800.0000 mg | ORAL_TABLET | Freq: Three times a day (TID) | ORAL | 0 refills | Status: DC | PRN

## 2020-10-18 MED ORDER — CEFTRIAXONE SODIUM 1 G IJ SOLR
1.0000 g | Freq: Once | INTRAMUSCULAR | Status: AC
  Administered 2020-10-18: 1 g via INTRAMUSCULAR

## 2020-10-18 MED ORDER — KETOROLAC TROMETHAMINE 30 MG/ML IJ SOLN
INTRAMUSCULAR | Status: AC
  Filled 2020-10-18: qty 1

## 2020-10-18 MED ORDER — CEFTRIAXONE SODIUM 1 G IJ SOLR
INTRAMUSCULAR | Status: AC
  Filled 2020-10-18: qty 10

## 2020-10-18 MED ORDER — IBUPROFEN 800 MG PO TABS: 800.0000 mg | ORAL_TABLET | Freq: Three times a day (TID) | ORAL | 0 refills | Status: DC

## 2020-10-18 MED ORDER — PENICILLIN V POTASSIUM 500 MG PO TABS: 500.0000 mg | ORAL_TABLET | Freq: Four times a day (QID) | ORAL | 0 refills | Status: DC

## 2020-10-18 MED ORDER — KETOROLAC TROMETHAMINE 30 MG/ML IJ SOLN
30.0000 mg | Freq: Once | INTRAMUSCULAR | Status: AC
  Administered 2020-10-18: 30 mg via INTRAMUSCULAR

## 2020-10-18 MED ORDER — PENICILLIN V POTASSIUM 500 MG PO TABS: 500.0000 mg | ORAL_TABLET | Freq: Three times a day (TID) | ORAL | 0 refills | Status: AC

## 2020-10-18 MED ORDER — LIDOCAINE HCL (PF) 1 % IJ SOLN
INTRAMUSCULAR | Status: AC
  Filled 2020-10-18: qty 2

## 2020-10-18 NOTE — Discharge Instructions (Addendum)
You received an injection of ceftriaxone, strong antibiotic today which should speed up the process of getting the infection your tooth under control.  You also received an injection of ketorolac for pain.  I sent a prescription for penicillin which she should take 3 times daily for the next 14 days which should completely resolve your infection.  I also sent a prescription for ibuprofen for your pain.  You can take this 3 times a day.

## 2020-10-18 NOTE — ED Triage Notes (Signed)
Pt presents with dental/ gum pain xs 3 weeks.

## 2020-10-18 NOTE — ED Provider Notes (Signed)
[facility]    CSN: [id number] Arrival date & time: 10/18/20  1534      History   Chief Complaint Chief Complaint  Patient presents with   Dental Pain    HPI Mallory Bailey is a 65 y.o. female.   Patient reports a history of significant dental caries, loss of multiple teeth.  Patient states for the past week she has had a significant amount of pain in her right upper gum and swelling, states she knows her to get tooth is broken, does not recall when this happened.  Patient states she has an appointment with a dentist on September 27.  Patient denies fever, aches, chills, purulent drainage from gums.  The history is provided by the patient.  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      Home Medications    Prior to Admission medications   Medication Sig Start Date End Date Taking? Authorizing Provider  FIBER FORMULA PO Take 1 tablet by mouth daily.     [provider]  ibuprofen (ADVIL) 800 MG tablet Take 1 tablet (800 mg total) by mouth every 8 (eight) hours as needed for up to 5 days for mild pain or moderate pain. 10/18/20 10/23/20  Lynden Oxford Scales, PA-C  Multiple Vitamins-Minerals (MULTIVITAMIN WITH MINERALS) tablet Take 1 tablet by mouth daily.    [provider]  penicillin v potassium (VEETID) 500 MG tablet Take 1 tablet (500 mg total) by mouth 3 (three) times daily for 14 days. 10/18/20 11/01/20  Lynden Oxford Scales, PA-C    Family  History Family History  Problem Relation Age of Onset   Dementia Mother    Stomach cancer Sister    Colon cancer Neg Hx    Colon polyps Neg Hx    Esophageal cancer Neg Hx    Rectal cancer Neg Hx     Social History Social History   Tobacco Use   Smoking status: Some Days    Types: Cigarettes   Smokeless tobacco: Never   Tobacco comments:    it depends on the situation- none in the last week   Substance Use Topics   Alcohol use: Yes    Comment: occasionally   Drug use: Yes    Types: Cocaine    Comment: last 2 weeks      Allergies   Patient has no known allergies.   Review of Systems Review of Systems Per HPI  Physical Exam Triage Vital Signs ED Triage Vitals [10/18/20 1647]  Enc Vitals Group     BP      Pulse      Resp      Temp      Temp src      SpO2      Weight      Height      Head Circumference  Peak Flow      Pain Score 7     Pain Loc      Pain Edu?      Excl. in [place]?    No data found.  Updated Vital Signs BP (!) 155/94 (BP Location: Left Arm)   Pulse 71   Temp 98.4 F (36.9 C) (Oral)   Resp 16   SpO2 97%   Visual Acuity Right Eye Distance:   Left Eye Distance:   Bilateral Distance:    Right Eye Near:   Left Eye Near:    Bilateral Near:     Physical Exam Constitutional:      Appearance: Normal appearance.  HENT:     Head: Normocephalic and atraumatic.     Right Ear: Tympanic membrane, ear canal and external ear normal.     Left Ear: Tympanic membrane, ear canal and external ear normal.     Nose: Nose normal. No congestion or rhinorrhea.     Mouth/Throat:     Mouth: Mucous membranes are moist.     Dentition: Abnormal dentition. Dental tenderness, gingival swelling, dental caries and dental abscesses present.  Eyes:     Conjunctiva/sclera: Conjunctivae normal.     Pupils: Pupils are equal, round, and reactive to light.  Cardiovascular:     Rate and Rhythm: Normal rate and regular rhythm.     Heart sounds: Normal heart  sounds.  Pulmonary:     Effort: Pulmonary effort is normal.     Breath sounds: Normal breath sounds.  Skin:    General: Skin is warm and dry.  Neurological:     General: No focal deficit present.     Mental Status: She is alert and oriented to person, place, and time.  Psychiatric:        Mood and Affect: Mood normal.        Behavior: Behavior normal.     UC Treatments / Results  Labs (all labs ordered are listed, but only abnormal results are displayed) Labs Reviewed - No data to display  EKG   Radiology No results found.  Procedures Procedures (including critical care time)  Medications Ordered in UC Medications  cefTRIAXone (ROCEPHIN) injection 1 g (has no administration in time range)  ketorolac (TORADOL) 30 MG/ML injection 30 mg (has no administration in time range)    Initial Impression / Assessment and Plan / UC Course  I have reviewed the triage vital signs and the nursing notes.  Pertinent labs & imaging results that were available during my care of the patient were reviewed by me and considered in my medical decision making (see chart for details).     Patient was provided with a ketorolac injection and ceftriaxone today in office.  Patient is also been provided with a 14-day course of penicillin and ibuprofen for 5 days.  Patient was strongly advised to keep her appointment with her dentist on September 27.  Patient states she will do so.  Patient verbalized understanding and agreement with plan.  All questions were addressed during visit. Final Clinical Impressions(s) / UC Diagnoses   Final diagnoses:  Pain, dental  Dental caries  Open fracture of tooth, initial encounter  Dental abscess  Gingivitis     Discharge Instructions      You received an injection of ceftriaxone, strong antibiotic today which should speed up the process of getting the infection your tooth under control.  You also received an injection of ketorolac for pain.  I sent a  prescription  for penicillin which she should take 3 times daily for the next 14 days which should completely resolve your infection.  I also sent a prescription for ibuprofen for your pain.  You can take this 3 times a day.     ED Prescriptions     Medication Sig Dispense Auth. Provider   penicillin v potassium (VEETID) 500 MG tablet  (Status: Discontinued) Take 1 tablet (500 mg total) by mouth 4 (four) times daily for 7 days. 40 tablet Lynden Oxford Scales, PA-C   ibuprofen (ADVIL) 800 MG tablet Take 1 tablet (800 mg total) by mouth every 8 (eight) hours as needed for up to 5 days for mild pain or moderate pain. 15 tablet Lynden Oxford Scales, PA-C   penicillin v potassium (VEETID) 500 MG tablet Take 1 tablet (500 mg total) by mouth 3 (three) times daily for 14 days. 40 tablet Lynden Oxford Scales, PA-C      PDMP not reviewed this encounter.   Lynden Oxford Scales, PA-C 10/18/20 1709

## 2020-10-21 NOTE — Telephone Encounter (Signed)
Ibuprofen sent electronically due to patient failing to take prescription with her.

## 2020-12-02 ENCOUNTER — Encounter (HOSPITAL_COMMUNITY): Payer: Self-pay | Admitting: *Deleted

## 2020-12-02 ENCOUNTER — Ambulatory Visit (HOSPITAL_COMMUNITY)
Admission: EM | Admit: 2020-12-02 | Discharge: 2020-12-02 | Disposition: A | Discharge status: Home / Self Care | Payer: Medicare HMO | Attending: Student | Admitting: Student

## 2020-12-02 ENCOUNTER — Other Ambulatory Visit: Payer: Self-pay

## 2020-12-02 DIAGNOSIS — R197 Diarrhea, unspecified: Secondary | ICD-10-CM | POA: Diagnosis not present

## 2020-12-02 DIAGNOSIS — R110 Nausea: Secondary | ICD-10-CM

## 2020-12-02 DIAGNOSIS — J0100 Acute maxillary sinusitis, unspecified: Secondary | ICD-10-CM

## 2020-12-02 DIAGNOSIS — J301 Allergic rhinitis due to pollen: Secondary | ICD-10-CM

## 2020-12-02 LAB — POC INFLUENZA A AND B ANTIGEN (URGENT CARE ONLY)
INFLUENZA A ANTIGEN, POC: NEGATIVE
INFLUENZA B ANTIGEN, POC: NEGATIVE

## 2020-12-02 MED ORDER — CETIRIZINE HCL 10 MG PO TABS: 10.0000 mg | ORAL_TABLET | Freq: Every day | ORAL | 2 refills | Status: DC

## 2020-12-02 MED ORDER — LOPERAMIDE HCL 2 MG PO CAPS: 2.0000 mg | ORAL_CAPSULE | Freq: Four times a day (QID) | ORAL | 0 refills | Status: DC | PRN

## 2020-12-02 MED ORDER — ONDANSETRON 8 MG PO TBDP: 8.0000 mg | ORAL_TABLET | Freq: Three times a day (TID) | ORAL | 0 refills | Status: DC | PRN

## 2020-12-02 MED ORDER — AMOXICILLIN-POT CLAVULANATE 875-125 MG PO TABS: 1.0000 | ORAL_TABLET | Freq: Two times a day (BID) | ORAL | 0 refills | Status: DC

## 2020-12-02 MED ORDER — FLUTICASONE PROPIONATE 50 MCG/ACT NA SUSP: 2.0000 | Freq: Every day | NASAL | 2 refills | Status: DC

## 2020-12-02 NOTE — ED Triage Notes (Signed)
Pt reports Ha ,ABD pain and vomiting that started last night.

## 2020-12-02 NOTE — ED Triage Notes (Signed)
Pt called with no answer

## 2020-12-02 NOTE — ED Provider Notes (Signed)
[place]    CSN: [id number] Arrival date & time: 12/02/20  0109      History   Chief Complaint Chief Complaint  Patient presents with   Headache   Emesis   Abdominal Pain    HPI Mallory Bailey is a 65 y.o. female presenting with untreated allergic rhinitis, sinusitis, nausea without vomiting, diarrhea. Medical history allergies. Describes throbbing frontal headache and facial pressure relieved by ibuprofen, associated with purulent nasal congestion. Denies worst headache of life, thunderclap headache, weakness/sensation changes in arms/legs, vision changes, shortness of breath, chest pain/pressure, photophobia, phonophobia, n/v/d.  Also with few episodes of watery diarrhea, and nausea without vomiting.  Tolerating fluids and food. States she checked her temp at home and this was normal .  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      Home Medications    Prior to Admission medications   Medication Sig Start Date End Date Taking? Authorizing Provider  amoxicillin-clavulanate (AUGMENTIN) 875-125 MG tablet Take 1 tablet by mouth every 12 (twelve) hours. 12/02/20  Yes Hazel Sams, PA-C  cetirizine (ZYRTEC ALLERGY) 10 MG tablet Take 1 tablet (10 mg total) by mouth daily. Take daily for seasonal allergies 12/02/20  Yes Hazel Sams, PA-C  fluticasone [facility]) 50 MCG/ACT nasal spray Place 2 sprays into both nostrils daily. 12/02/20  Yes Hazel Sams, PA-C  loperamide  (IMODIUM) 2 MG capsule Take 1 capsule (2 mg total) by mouth 4 (four) times daily as needed for diarrhea or loose stools. 12/02/20  Yes Hazel Sams, PA-C  ondansetron (ZOFRAN ODT) 8 MG disintegrating tablet Take 1 tablet (8 mg total) by mouth every 8 (eight) hours as needed for nausea or vomiting. 12/02/20  Yes Hazel Sams, PA-C  FIBER FORMULA PO Take 1 tablet by mouth daily.     [provider]  ibuprofen (ADVIL) 800 MG tablet Take 1 tablet (800 mg total) by mouth 3 (three) times daily. 10/18/20   Lynden Oxford Scales, PA-C  Multiple Vitamins-Minerals (MULTIVITAMIN WITH MINERALS) tablet Take 1 tablet by mouth daily.    [provider]    Family History Family History  Problem Relation Age of Onset   Dementia Mother    Stomach cancer Sister    Colon cancer Neg Hx    Colon polyps Neg Hx    Esophageal cancer Neg Hx    Rectal cancer Neg Hx     Social History Social History   Tobacco Use   Smoking status: Some Days    Types: Cigarettes   Smokeless tobacco: Never   Tobacco comments:    it depends on the situation- none in the last week   Substance Use Topics   Alcohol use: Yes    Comment: occasionally   Drug use: Yes    Types: Cocaine    Comment: last 2 weeks  Allergies   Patient has no known allergies.   Review of Systems Review of Systems  Constitutional:  Negative for appetite change, chills and fever.  HENT:  Positive for congestion and sinus pressure. Negative for ear pain, rhinorrhea, sinus pain and sore throat.   Eyes:  Negative for redness and visual disturbance.  Respiratory:  Positive for cough. Negative for chest tightness, shortness of breath and wheezing.   Cardiovascular:  Negative for chest pain and palpitations.  Gastrointestinal:  Positive for diarrhea, nausea and vomiting. Negative for abdominal pain and constipation.  Genitourinary:  Negative for dysuria, frequency and urgency.  Musculoskeletal:  Negative for myalgias.   Neurological:  Negative for dizziness, weakness and headaches.  Psychiatric/Behavioral:  Negative for confusion.   All other systems reviewed and are negative.   Physical Exam Triage Vital Signs ED Triage Vitals  Enc Vitals Group     BP 12/02/20 0859 128/75     Pulse Rate 12/02/20 0859 74     Resp 12/02/20 0859 18     Temp 12/02/20 0859 98.2 F (36.8 C)     Temp src --      SpO2 12/02/20 0859 97 %     Weight --      Height --      Head Circumference --      Peak Flow --      Pain Score 12/02/20 0857 7     Pain Loc --      Pain Edu? --      Excl. in [place]? --    No data found.  Updated Vital Signs BP 128/75   Pulse 74   Temp 98.2 F (36.8 C)   Resp 18   SpO2 97%   Visual Acuity Right Eye Distance:   Left Eye Distance:   Bilateral Distance:    Right Eye Near:   Left Eye Near:    Bilateral Near:     Physical Exam Vitals reviewed.  Constitutional:      General: She is not in acute distress.    Appearance: Normal appearance. She is not ill-appearing.  HENT:     Head: Normocephalic and atraumatic.     Right Ear: Tympanic membrane, ear canal and external ear normal. No tenderness. No middle ear effusion. There is no impacted cerumen. Tympanic membrane is not perforated, erythematous, retracted or bulging.     Left Ear: Tympanic membrane, ear canal and external ear normal. No tenderness.  No middle ear effusion. There is no impacted cerumen. Tympanic membrane is not perforated, erythematous, retracted or bulging.     Nose: Congestion present.     Right Sinus: No maxillary sinus tenderness or frontal sinus tenderness.     Left Sinus: Maxillary sinus tenderness and frontal sinus tenderness present.     Mouth/Throat:     Mouth: Mucous membranes are moist.     Pharynx: Uvula midline. No oropharyngeal exudate or posterior oropharyngeal erythema.  Eyes:     Extraocular Movements: Extraocular movements intact.     Pupils: Pupils are equal, round, and reactive to light.   Cardiovascular:     Rate and Rhythm: Normal rate and regular rhythm.     Heart sounds: Normal heart sounds.  Pulmonary:     Effort: Pulmonary effort is normal.     Breath sounds: Normal breath sounds. No decreased breath sounds, wheezing, rhonchi or rales.  Abdominal:     Palpations: Abdomen is soft.     Tenderness: There is no abdominal tenderness. There is  no guarding or rebound.  Lymphadenopathy:     Cervical: No cervical adenopathy.     Right cervical: No superficial cervical adenopathy.    Left cervical: No superficial cervical adenopathy.  Neurological:     General: No focal deficit present.     Mental Status: She is alert and oriented to person, place, and time.     Comments: CN 2-12 grossly intact, PERRLA, EOMI. Strength and sensation grossly intact upper and lower extremities.   Psychiatric:        Mood and Affect: Mood normal.        Behavior: Behavior normal.        Thought Content: Thought content normal.        Judgment: Judgment normal.     UC Treatments / Results  Labs (all labs ordered are listed, but only abnormal results are displayed) Labs Reviewed  POC INFLUENZA A AND B ANTIGEN (URGENT CARE ONLY)    EKG   Radiology No results found.  Procedures Procedures (including critical care time)  Medications Ordered in UC Medications - No data to display  Initial Impression / Assessment and Plan / UC Course  I have reviewed the triage vital signs and the nursing notes.  Pertinent labs & imaging results that were available during my care of the patient were reviewed by me and considered in my medical decision making (see chart for details).     This patient is a very pleasant 65 y.o. year old female presenting with acute sinusitis related to untreated allergic rhinitis. Today this pt is afebrile nontachycardic nontachypneic, oxygenating well on room air, no wheezes rhonchi or rales.   Augmentin, flonase, zyrtec. Also sent imodium, zofran. I suspect she  is also coming down with viral syndrome; rapid influenza negative today .  ED return precautions discussed. Patient verbalizes understanding and agreement.  .   Final Clinical Impressions(s) / UC Diagnoses   Final diagnoses:  Seasonal allergic rhinitis due to pollen  Acute non-recurrent maxillary sinusitis  Nausea without vomiting  Diarrhea, unspecified type     Discharge Instructions      -Start the antibiotic-Augmentin (amoxicillin-clavulanate), 1 pill every 12 hours for 7 days.  You can take this with food like with breakfast and dinner. -Take the Imodium (loperamide) up to 4 times daily for diarrhea. -Take the Zofran (ondansetron) up to 3 times daily for nausea and vomiting. Dissolve one pill under your tongue or between your teeth and your cheek. -Zyrtec once daily  -Flonase nasal steroid: place 2 sprays into both nostrils in the morning and at bedtime for at least 7 days. Continue for longer if this is helping.    ED Prescriptions     Medication Sig Dispense Auth. Provider   amoxicillin-clavulanate (AUGMENTIN) 875-125 MG tablet Take 1 tablet by mouth every 12 (twelve) hours. 14 tablet Hazel Sams, PA-C   fluticasone [facility]) 50 MCG/ACT nasal spray Place 2 sprays into both nostrils daily. 15 mL Hazel Sams, PA-C   cetirizine (ZYRTEC ALLERGY) 10 MG tablet Take 1 tablet (10 mg total) by mouth daily. Take daily for seasonal allergies 30 tablet Hazel Sams, PA-C   ondansetron (ZOFRAN ODT) 8 MG disintegrating tablet Take 1 tablet (8 mg total) by mouth every 8 (eight) hours as needed for nausea or vomiting. 20 tablet Hazel Sams, PA-C   loperamide (IMODIUM) 2 MG capsule Take 1 capsule (2 mg total) by mouth 4 (four) times daily as needed for diarrhea or loose stools. 12 capsule Phillip Heal,  Sherlon Handing, PA-C      PDMP not reviewed this encounter.   Hazel Sams, PA-C 12/02/20 1003

## 2020-12-02 NOTE — Discharge Instructions (Addendum)
-  Start the antibiotic-Augmentin (amoxicillin-clavulanate), 1 pill every 12 hours for 7 days.  You can take this with food like with breakfast and dinner. -Take the Imodium (loperamide) up to 4 times daily for diarrhea. -Take the Zofran (ondansetron) up to 3 times daily for nausea and vomiting. Dissolve one pill under your tongue or between your teeth and your cheek. -Zyrtec once daily  -Flonase nasal steroid: place 2 sprays into both nostrils in the morning and at bedtime for at least 7 days. Continue for longer if this is helping.

## 2021-04-22 ENCOUNTER — Other Ambulatory Visit: Payer: Self-pay | Admitting: Family Medicine

## 2021-04-22 DIAGNOSIS — R748 Abnormal levels of other serum enzymes: Secondary | ICD-10-CM

## 2021-05-01 ENCOUNTER — Encounter: Payer: Self-pay | Admitting: Internal Medicine

## 2021-05-01 ENCOUNTER — Ambulatory Visit
Admission: RE | Admit: 2021-05-01 | Discharge: 2021-05-01 | Disposition: A | Discharge status: Home / Self Care | Payer: Medicare HMO | Source: Ambulatory Visit | Attending: Family Medicine | Admitting: Family Medicine

## 2021-05-01 DIAGNOSIS — R748 Abnormal levels of other serum enzymes: Secondary | ICD-10-CM

## 2021-05-02 ENCOUNTER — Other Ambulatory Visit: Payer: Medicare HMO

## 2021-07-13 ENCOUNTER — Other Ambulatory Visit: Payer: Self-pay

## 2021-07-13 ENCOUNTER — Emergency Department (HOSPITAL_COMMUNITY): Payer: Medicare (Managed Care)

## 2021-07-13 ENCOUNTER — Encounter (HOSPITAL_COMMUNITY): Payer: Self-pay | Admitting: Emergency Medicine

## 2021-07-13 ENCOUNTER — Emergency Department (HOSPITAL_COMMUNITY)
Admission: EM | Admit: 2021-07-13 | Discharge: 2021-07-14 | Discharge status: Against Medical Advice | Payer: Medicare (Managed Care) | Attending: Physician Assistant | Admitting: Physician Assistant

## 2021-07-13 DIAGNOSIS — Z5321 Procedure and treatment not carried out due to patient leaving prior to being seen by health care provider: Secondary | ICD-10-CM | POA: Insufficient documentation

## 2021-07-13 DIAGNOSIS — R519 Headache, unspecified: Secondary | ICD-10-CM | POA: Diagnosis not present

## 2021-07-13 DIAGNOSIS — R42 Dizziness and giddiness: Secondary | ICD-10-CM | POA: Insufficient documentation

## 2021-07-13 DIAGNOSIS — R4789 Other speech disturbances: Secondary | ICD-10-CM | POA: Diagnosis not present

## 2021-07-13 DIAGNOSIS — F17200 Nicotine dependence, unspecified, uncomplicated: Secondary | ICD-10-CM | POA: Diagnosis not present

## 2021-07-13 LAB — COMPREHENSIVE METABOLIC PANEL
ALT: 22 U/L (ref 0–44)
AST: 41 U/L (ref 15–41)
Albumin: 3.4 g/dL — ABNORMAL LOW (ref 3.5–5.0)
Alkaline Phosphatase: 131 U/L — ABNORMAL HIGH (ref 38–126)
Anion gap: 10 (ref 5–15)
BUN: 12 mg/dL (ref 8–23)
CO2: 22 mmol/L (ref 22–32)
Calcium: 9 mg/dL (ref 8.9–10.3)
Chloride: 105 mmol/L (ref 98–111)
Creatinine, Ser: 0.89 mg/dL (ref 0.44–1.00)
GFR, Estimated: >60 mL/min (ref >60)
Glucose, Bld: 94 mg/dL (ref 70–99)
Potassium: 4.2 mmol/L (ref 3.5–5.1)
Sodium: 137 mmol/L (ref 135–145)
Total Bilirubin: 0.4 mg/dL (ref 0.3–1.2)
Total Protein: 6.3 g/dL — ABNORMAL LOW (ref 6.5–8.1)

## 2021-07-13 LAB — URINALYSIS, ROUTINE W REFLEX MICROSCOPIC
Bilirubin Urine: NEGATIVE
Glucose, UA: NEGATIVE mg/dL
Hgb urine dipstick: NEGATIVE
Ketones, ur: NEGATIVE mg/dL
Leukocytes,Ua: NEGATIVE
Nitrite: NEGATIVE
Protein, ur: NEGATIVE mg/dL
Specific Gravity, Urine: 1.006 (ref 1.005–1.030)
pH: 6 (ref 5.0–8.0)

## 2021-07-13 LAB — CBC WITH DIFFERENTIAL/PLATELET
Abs Immature Granulocytes: 0.01 10*3/uL (ref 0.00–0.07)
Basophils Absolute: 0.1 10*3/uL (ref 0.0–0.1)
Basophils Relative: 1 %
Eosinophils Absolute: 0.3 10*3/uL (ref 0.0–0.5)
Eosinophils Relative: 5 %
HCT: 46.5 % — ABNORMAL HIGH (ref 36.0–46.0)
Hemoglobin: 14 g/dL (ref 12.0–15.0)
Immature Granulocytes: 0 %
Lymphocytes Relative: 44 %
Lymphs Abs: 3.1 10*3/uL (ref 0.7–4.0)
MCH: 24.3 pg — ABNORMAL LOW (ref 26.0–34.0)
MCHC: 30.1 g/dL (ref 30.0–36.0)
MCV: 80.6 fL (ref 80.0–100.0)
Monocytes Absolute: 0.7 10*3/uL (ref 0.1–1.0)
Monocytes Relative: 10 %
Neutro Abs: 2.8 10*3/uL (ref 1.7–7.7)
Neutrophils Relative %: 40 %
Platelets: 239 10*3/uL (ref 150–400)
RBC: 5.77 MIL/uL — ABNORMAL HIGH (ref 3.87–5.11)
RDW: 16.2 % — ABNORMAL HIGH (ref 11.5–15.5)
WBC: 7 10*3/uL (ref 4.0–10.5)
nRBC: 0 % (ref 0.0–0.2)

## 2021-07-13 LAB — RAPID URINE DRUG SCREEN, HOSP PERFORMED
Amphetamines: NOT DETECTED
Barbiturates: NOT DETECTED
Benzodiazepines: NOT DETECTED
Cocaine: NOT DETECTED
Opiates: NOT DETECTED
Tetrahydrocannabinol: NOT DETECTED

## 2021-07-13 NOTE — ED Triage Notes (Signed)
Pt states that at 3pm she was talking on the phone and started having difficulty with speech. States she laid down and took and antihistamine and she felt better. Pt states she then experienced dizziness and headache. Pt also states she has new onset weakness to right side. Stroke screen negative at time of triage.

## 2021-07-13 NOTE — ED Notes (Signed)
Patient called multiple times with no response

## 2021-07-13 NOTE — Progress Notes (Signed)
Have tried to get this patient 3 times with no answer for her ct.

## 2021-07-13 NOTE — ED Provider Triage Note (Signed)
Emergency Medicine Provider Triage Evaluation Note  Mallory Bailey , a 66 y.o. female  was evaluated in triage.  Pt complains of headache, difficulty with her speech around 3 PM today, episode lasted about a minute.  She felt she could not find the words while talking on the phone.  Began to feel her arm was somewhat weaker, however this later resolved.  She does report feeling lightheaded, dizzy, reports she has not had any previous strokes in the past, but also states that this feels similar to a previous episode in the past?.  She does endorse tobacco along with alcohol use.  She is also describing dizziness along with a generalized headache..  Review of Systems  Positive: Headache, trouble with speech Negative: Fever, chest pain, sob  Physical Exam  BP (!) 142/97   Pulse 85   Resp 18   SpO2 100%  Gen:   Awake, no distress   Resp:  Normal effort  MSK:   Moves extremities without difficulty  Other:    Medical Decision Making  Medically screening exam initiated at 7:49 PM.  Appropriate orders placed.  Mallory Bailey was informed that the remainder of the evaluation will be completed by another provider, this initial triage assessment does not replace that evaluation, and the importance of remaining in the ED until their evaluation is complete.  Alert, oriented, thought content appropriate. Speech fluent without evidence of aphasia. Able to follow 2 step commands without difficulty.  Cranial Nerves:  II:  Peripheral visual fields grossly normal, pupils, round, reactive to light III,IV, VI: ptosis not present, extra-ocular motions intact bilaterally  V,VII: smile symmetric, facial light touch sensation equal VIII: hearing grossly normal bilaterally  IX,X: midline uvula rise  XI: bilateral shoulder shrug equal and strong XII: midline tongue extension  Motor:  5/5 in upper and lower extremities bilaterally including strong and equal grip strength and dorsiflexion/plantar  flexion Sensory: light touch normal in all extremities.  Cerebellar: normal finger-to-nose with bilateral upper extremities, pronator drift negative     Janeece Fitting, PA-C 07/13/21 1951

## 2021-10-10 ENCOUNTER — Encounter: Payer: Self-pay | Admitting: Gastroenterology

## 2021-10-20 ENCOUNTER — Encounter: Payer: Self-pay | Admitting: Gastroenterology

## 2021-11-04 ENCOUNTER — Ambulatory Visit (AMBULATORY_SURGERY_CENTER): Payer: Self-pay

## 2021-11-04 ENCOUNTER — Telehealth: Payer: Self-pay

## 2021-11-04 VITALS — Ht 65.5 in | Wt 144.0 lb

## 2021-11-04 DIAGNOSIS — Z86010 Personal history of colonic polyps: Secondary | ICD-10-CM

## 2021-11-04 NOTE — Progress Notes (Signed)
Pt was no show for PV this AM, pt called and was able to do PV over the phone today at 1030.  No egg or soy allergy known to patient   No issues known to pt with past sedation with any surgeries or procedures  Patient denies ever being told they had issues or difficulty with intubation   No FH of Malignant Hyperthermia Pt is not on diet pills Pt is not on  home 02   Pt is not on blood thinners   Pt states issues with constipation since starting iron, instructed to start miralax daily for 5 days prior to colonoscopy and to hold fiber , iron x 5 days and advil x 7 days, too.   No A fib or A flutter  Have any cardiac testing pending--NO   Miralax prep used

## 2021-11-04 NOTE — Telephone Encounter (Signed)
No show for PV today. Called only contact # x2. Voicemail is full, unable to accept new message. Spoke with daughter Cicero Duck at her work. She will check on patient as pt was planning to attend her appointment as of yesterday.

## 2021-11-13 ENCOUNTER — Other Ambulatory Visit: Payer: Self-pay | Admitting: Family

## 2021-11-13 DIAGNOSIS — Z13820 Encounter for screening for osteoporosis: Secondary | ICD-10-CM

## 2021-11-13 DIAGNOSIS — Z1231 Encounter for screening mammogram for malignant neoplasm of breast: Secondary | ICD-10-CM

## 2021-11-18 ENCOUNTER — Encounter: Payer: Self-pay | Admitting: Gastroenterology

## 2021-11-24 ENCOUNTER — Ambulatory Visit
Admission: RE | Admit: 2021-11-24 | Discharge: 2021-11-24 | Disposition: A | Discharge status: Home / Self Care | Payer: Medicare (Managed Care) | Source: Ambulatory Visit | Attending: Internal Medicine | Admitting: Internal Medicine

## 2021-11-24 ENCOUNTER — Other Ambulatory Visit: Payer: Self-pay | Admitting: Internal Medicine

## 2021-11-24 DIAGNOSIS — Z111 Encounter for screening for respiratory tuberculosis: Secondary | ICD-10-CM

## 2021-11-25 ENCOUNTER — Telehealth: Payer: Self-pay | Admitting: Gastroenterology

## 2021-11-25 NOTE — Telephone Encounter (Signed)
Inbound call from patient stating that she is scheduled to have a colonoscopy tomorrow at 9:00 and was looking at her instructions and noticed she was not to have breakfast this morning, but did eat breakfast. Patient is requesting a call back to discuss. Pleas advise.

## 2021-11-25 NOTE — Telephone Encounter (Signed)
Spoke with pt- states she ate breakfast this am but has been on clear liquids since then.  I encouraged her to push her fluids, stay on clear liquids until NPO time, no further solid foods.  Understanding voiced

## 2021-11-26 ENCOUNTER — Ambulatory Visit (AMBULATORY_SURGERY_CENTER): Payer: Medicare (Managed Care) | Admitting: Gastroenterology

## 2021-11-26 ENCOUNTER — Encounter: Payer: Self-pay | Admitting: Gastroenterology

## 2021-11-26 VITALS — BP 145/83 | HR 53 | Temp 98.1°F | Resp 12 | Ht 65.5 in | Wt 144.0 lb

## 2021-11-26 DIAGNOSIS — D123 Benign neoplasm of transverse colon: Secondary | ICD-10-CM | POA: Diagnosis not present

## 2021-11-26 DIAGNOSIS — Z09 Encounter for follow-up examination after completed treatment for conditions other than malignant neoplasm: Secondary | ICD-10-CM

## 2021-11-26 DIAGNOSIS — Z86010 Personal history of colonic polyps: Secondary | ICD-10-CM

## 2021-11-26 DIAGNOSIS — D122 Benign neoplasm of ascending colon: Secondary | ICD-10-CM

## 2021-11-26 MED ORDER — SODIUM CHLORIDE 0.9 % IV SOLN
500.0000 mL | Freq: Once | INTRAVENOUS | Status: DC
Start: 2021-11-26 — End: 2021-11-26

## 2021-11-26 NOTE — Patient Instructions (Signed)
Handouts on polyps, diverticulosis, and hemorrhoids given to you today Await pathology results   YOU HAD AN ENDOSCOPIC PROCEDURE TODAY AT THE [facility]:   Refer to the procedure report that was given to you for any specific questions about what was found during the examination.  If the procedure report does not answer your questions, please call your gastroenterologist to clarify.  If you requested that your care partner not be given the details of your procedure findings, then the procedure report has been included in a sealed envelope for you to review at your convenience later.  YOU SHOULD EXPECT: Some feelings of bloating in the abdomen. Passage of more gas than usual.  Walking can help get rid of the air that was put into your GI tract during the procedure and reduce the bloating. If you had a lower endoscopy (such as a colonoscopy or flexible sigmoidoscopy) you may notice spotting of blood in your stool or on the toilet paper. If you underwent a bowel prep for your procedure, you may not have a normal bowel movement for a few days.  Please Note:  You might notice some irritation and congestion in your nose or some drainage.  This is from the oxygen used during your procedure.  There is no need for concern and it should clear up in a day or so.  SYMPTOMS TO REPORT IMMEDIATELY:  Following lower endoscopy (colonoscopy or flexible sigmoidoscopy):  Excessive amounts of blood in the stool  Significant tenderness or worsening of abdominal pains  Swelling of the abdomen that is new, acute  Fever of 100F or higher  For urgent or emergent issues, a gastroenterologist can be reached at any hour by calling [phone]. Do not use MyChart messaging for urgent concerns.    DIET:  We do recommend a small meal at first, but then you may proceed to your regular diet.  Drink plenty of fluids but you should avoid alcoholic beverages for 24 hours.  ACTIVITY:  You should plan to take it  easy for the rest of today and you should NOT DRIVE or use heavy machinery until tomorrow (because of the sedation medicines used during the test).    FOLLOW UP: Our staff will call the number listed on your records the next business day following your procedure.  We will call around 7:15- 8:00 am to check on you and address any questions or concerns that you may have regarding the information given to you following your procedure. If we do not reach you, we will leave a message.     If any biopsies were taken you will be contacted by phone or by letter within the next 1-3 weeks.  Please call us at [phone] if you have not heard about the biopsies in 3 weeks.    SIGNATURES/CONFIDENTIALITY: You and/or your care partner have signed paperwork which will be entered into your electronic medical record.  These signatures attest to the fact that that the information above on your After Visit Summary has been reviewed and is understood.  Full responsibility of the confidentiality of this discharge information lies with you and/or your care-partner. 

## 2021-11-26 NOTE — Progress Notes (Signed)
Pt's states no medical or surgical changes since previsit or office visit. 

## 2021-11-26 NOTE — Progress Notes (Signed)
Called to room to assist during endoscopic procedure.  Patient ID and intended procedure confirmed with present staff. Received instructions for my participation in the procedure from the performing physician.  

## 2021-11-26 NOTE — Progress Notes (Signed)
Referring Provider: Nolene Ebbs, MD Primary Care Physician:  Drue Flirt, MD  Indication for Procedure:  Colon cancer Surveillance   IMPRESSION:  Need for colon cancer surveillance Appropriate candidate for monitored anesthesia care  PLAN: Colonoscopy in the [place] today   HPI: Mallory Bailey is a 66 y.o. female presents for surveillance colonoscopy.  Prior endoscopic history: Colonoscopy 09/25/2018: - Non-bleeding internal hemorrhoids. - Diverticulosis in the sigmoid colon and in the descending colon. - Two 4 to 10 mm polyps in the ascending colon, removed with a cold snare. Resected and retrieved. - One less than 1 mm polyp in the ascending colon, removed with a cold biopsy forceps. Resected and retrieved. - One 2 mm polyp at the hepatic flexure, removed with a cold snare. Resected and retrieved.  Surgical [P], colon, ascending, hepatic flexure, polyp (4) - TUBULAR ADENOMA(S) - NEGATIVE FOR HIGH-GRADE DYSPLASIA OR MALIGNANCY  No known family history of colon cancer or polyps. No family history of uterine/endometrial cancer, pancreatic cancer or gastric/stomach cancer.   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     cetirizine (ZYRTEC ALLERGY) 10 MG tablet Take 1 tablet (10 mg total) by mouth daily. Take daily for seasonal allergies 30 tablet 2   ferrous sulfate 325 (65 FE) MG tablet Take 325 mg by mouth daily with breakfast.     FIBER FORMULA PO Take 1 tablet by mouth daily.  (Patient not  taking: Reported on 11/26/2021)     fluticasone (FLONASE) 50 MCG/ACT nasal spray Place 2 sprays into both nostrils daily. (Patient not taking: Reported on 11/04/2021) 15 mL 2   ibuprofen (ADVIL) 800 MG tablet Take 1 tablet (800 mg total) by mouth 3 (three) times daily. (Patient not taking: Reported on 11/04/2021) 21 tablet 0   loperamide (IMODIUM) 2 MG capsule Take 1 capsule (2 mg total) by mouth 4 (four) times daily as needed for diarrhea or loose stools. (Patient not taking: Reported on 11/04/2021) 12 capsule 0   Multiple Vitamins-Minerals (MULTIVITAMIN WITH MINERALS) tablet Take 1 tablet by mouth daily.     ondansetron (ZOFRAN ODT) 8 MG disintegrating tablet Take 1 tablet (8 mg total) by mouth every 8 (eight) hours as needed for nausea or vomiting. (Patient not taking: Reported on 11/04/2021) 20 tablet 0   Current Facility-Administered Medications  Medication Dose Route Frequency Provider Last Rate Last Admin   0.9 %  sodium chloride infusion  500 mL Intravenous Once Thornton Park, MD        Allergies as of 11/26/2021 - Review Complete 11/26/2021  Allergen Reaction Noted   Pollen extract  11/12/2021    Family History  Problem Relation Age of Onset   Dementia Mother    Colon cancer Neg Hx    Colon polyps Neg Hx    Esophageal cancer Neg Hx    Rectal cancer Neg Hx  Stomach cancer Neg Hx      Physical Exam: General:   Alert,  well-nourished, pleasant and cooperative in NAD Head:  Normocephalic and atraumatic. Eyes:  Sclera clear, no icterus.   Conjunctiva pink. Mouth:  No deformity or lesions.   Neck:  Supple; no masses or thyromegaly. Lungs:  Clear throughout to auscultation.   No wheezes. Heart:  Regular rate and rhythm; no murmurs. Abdomen:  Soft, non-tender, nondistended, normal bowel sounds, no rebound or guarding.  Msk:  Symmetrical. No boney deformities LAD: No inguinal or umbilical LAD Extremities:  No clubbing or edema. Neurologic:  Alert and  oriented x4;  grossly  nonfocal Skin:  No obvious rash or bruise. Psych:  Alert and cooperative. Normal mood and affect.     [name] L. Tarri Glenn, MD, MPH 11/26/2021, 9:25 AM

## 2021-11-26 NOTE — Progress Notes (Signed)
To pacu, VSS. Report to [email or web address] 

## 2021-11-26 NOTE — Op Note (Signed)
[place] Patient Name: Mallory Bailey Procedure Date: 11/26/2021 9:25 AM MRN: [id number] Endoscopist: Thornton Park MD, MD, [id number] Age: 66 Referring MD:  Date of Birth: January 07, 1956 Gender: Female Account #: [id number] Procedure:                Colonoscopy Indications:              High risk colon cancer surveillance: Personal                            history of multiple (3 or more) adenomas                           4 tubular adenomas removed on colonoscopy in 2020 Medicines:                Monitored Anesthesia Care Procedure:                Pre-Anesthesia Assessment:                           - Prior to the procedure, a History and Physical                            was performed, and patient medications and                            allergies were reviewed. The patient's tolerance of                            previous anesthesia was also reviewed. The risks                            and benefits of the procedure and the sedation                            options and risks were discussed with the patient.                            All questions were answered, and informed consent                            was obtained. Prior Anticoagulants: The patient has                            taken no anticoagulant or antiplatelet agents. ASA                            Grade Assessment: II - A patient with mild systemic                            disease. After reviewing the risks and benefits,                            the patient was deemed in satisfactory condition to  undergo the procedure.                           After obtaining informed consent, the colonoscope                            was passed under direct vision. Throughout the                            procedure, the patient's blood pressure, pulse, and                            oxygen saturations were monitored continuously. The                            Olympus  [id number]-HQ190L [id number]) Colonoscope was                            introduced through the anus and advanced to the 3                            cm into the ileum. A second forward view of the                            right colon was performed. The colonoscopy was                            performed without difficulty. The patient tolerated                            the procedure well. The quality of the bowel                            preparation was good. The terminal ileum, ileocecal                            valve, appendiceal orifice, and rectum were                            photographed. Scope In: 9:33:39 AM Scope Out: 9:52:22 AM Scope Withdrawal Time: 0 hours 15 minutes 13 seconds  Total Procedure Duration: 0 hours 18 minutes 43 seconds  Findings:                 The perianal and digital rectal examinations were                            normal.                           Non-bleeding internal hemorrhoids were found.                           Multiple medium-mouthed and small-mouthed  diverticula were found in the sigmoid colon and                            descending colon.                           A 2 mm polyp was found in the transverse colon. The                            polyp was sessile. The polyp was removed with a                            cold snare. Resection and retrieval were complete.                            Estimated blood loss was minimal.                           A 1 mm polyp was found in the proximal ascending                            colon. The polyp was flat. The polyp was removed                            with a cold snare. Resection and retrieval were                            complete. Estimated blood loss was minimal.                           The exam was otherwise without abnormality on                            direct and retroflexion views. Complications:            No immediate complications. Estimated Blood  Loss:     Estimated blood loss was minimal. Impression:               - Non-bleeding internal hemorrhoids.                           - Diverticulosis in the sigmoid colon and in the                            descending colon.                           - One 2 mm polyp in the transverse colon, removed                            with a cold snare. Resected and retrieved.                           - One 1 mm polyp in the proximal ascending colon,  removed with a cold snare. Resected and retrieved.                           - The examination was otherwise normal on direct                            and retroflexion views. Recommendation:           - Patient has a contact number available for                            emergencies. The signs and symptoms of potential                            delayed complications were discussed with the                            patient. Return to normal activities tomorrow.                            Written discharge instructions were provided to the                            patient.                           - High fiber diet.                           - Continue present medications.                           - Await pathology results.                           - Repeat colonoscopy in 5 years for surveillance                            regardless of pathology results given the history                            of tubular adenomas.                           - Emerging evidence supports eating a diet of                            fruits, vegetables, grains, calcium, and yogurt                            while reducing red meat and alcohol may reduce the                            risk of colon cancer.                           -  Thank you for allowing me to be involved in your                            colon cancer prevention. Thornton Park MD, MD 11/26/2021 9:58:10 AM This report has been signed electronically.

## 2021-11-27 ENCOUNTER — Telehealth: Payer: Self-pay

## 2021-11-27 NOTE — Telephone Encounter (Signed)
  Follow up Call-     11/26/2021    8:25 AM  Call back number  Post procedure Call Back phone  # [phone]  Permission to leave phone message Yes     Patient questions:  Do you have a fever, pain , or abdominal swelling? No. Pain Score  0 *  Have you tolerated food without any problems? Yes.    Have you been able to return to your normal activities? Yes.    Do you have any questions about your discharge instructions: Diet   No. Medications  No. Follow up visit  No.  Do you have questions or concerns about your Care? No.  Actions: * If pain score is 4 or above: No action needed, pain <4.

## 2021-12-04 ENCOUNTER — Encounter: Payer: Medicare (Managed Care) | Admitting: Infectious Diseases

## 2021-12-09 ENCOUNTER — Encounter: Payer: Medicare (Managed Care) | Admitting: Internal Medicine

## 2021-12-09 ENCOUNTER — Other Ambulatory Visit (HOSPITAL_COMMUNITY): Payer: Self-pay

## 2021-12-09 ENCOUNTER — Telehealth: Payer: Self-pay

## 2021-12-09 ENCOUNTER — Other Ambulatory Visit: Payer: Self-pay

## 2021-12-09 NOTE — Telephone Encounter (Signed)
[facility] Patient Teacher, English as a foreign language completed.    The patient is insured through [employer].  Medication will need a PA.  We will continue to follow to see if copay assistance is needed.  Ileene Patrick, [facility] Patient [facility] for [facility] Disease Phone: [phone] Fax:  [phone]

## 2021-12-11 ENCOUNTER — Encounter: Payer: Medicare (Managed Care) | Admitting: Internal Medicine

## 2021-12-18 ENCOUNTER — Ambulatory Visit: Payer: Medicare (Managed Care)

## 2021-12-18 ENCOUNTER — Ambulatory Visit
Admission: RE | Admit: 2021-12-18 | Discharge: 2021-12-18 | Disposition: A | Discharge status: Home / Self Care | Payer: Medicare (Managed Care) | Source: Ambulatory Visit | Attending: Family | Admitting: Family

## 2021-12-18 DIAGNOSIS — Z1231 Encounter for screening mammogram for malignant neoplasm of breast: Secondary | ICD-10-CM

## 2021-12-23 ENCOUNTER — Encounter: Payer: Medicare (Managed Care) | Admitting: Internal Medicine

## 2021-12-30 ENCOUNTER — Other Ambulatory Visit: Payer: Self-pay

## 2021-12-30 ENCOUNTER — Ambulatory Visit (INDEPENDENT_AMBULATORY_CARE_PROVIDER_SITE_OTHER): Payer: Medicare (Managed Care) | Admitting: Internal Medicine

## 2021-12-30 ENCOUNTER — Other Ambulatory Visit (HOSPITAL_COMMUNITY): Payer: Self-pay

## 2021-12-30 ENCOUNTER — Encounter: Payer: Self-pay | Admitting: Internal Medicine

## 2021-12-30 VITALS — BP 138/81 | HR 71 | Temp 97.9°F | Wt 147.0 lb

## 2021-12-30 DIAGNOSIS — B182 Chronic viral hepatitis C: Secondary | ICD-10-CM | POA: Diagnosis not present

## 2021-12-30 MED ORDER — SOFOSBUVIR-VELPATASVIR 400-100 MG PO TABS
1.0000 | ORAL_TABLET | Freq: Every day | ORAL | 2 refills | Status: DC
  Filled 2021-12-30: qty 28, 28d supply, fill #0

## 2021-12-30 NOTE — Progress Notes (Signed)
[facility] Infectious Disease      Reason for Consult: chronic hepatitis C    Referring Physician: Arta Silence FNP    Patient ID: Mallory Bailey, female    DOB: [birth year], 66 y.o.   MRN: [id number]  HPI:   She is here for evaluation of a new diagnosis of chronic hepatitis C.   She has a history of drug use including crack cocaine, previous partner with IVDU history about 30 years ago and recently seen for routine care and found a positive hepatitis C Ab and confirmation of active infection with positive viral load and genotype 1b.  She has had no issues with fatigue.     Past Medical History:  Diagnosis Date   Allergy    seasonal   Anemia    Cataract    forming    Chicken pox    Insomnia    Positive TB test    never been tx for TB, told has the "germ: per pt,    Substance abuse ([place])     Prior to Admission medications   Medication Sig Start Date End Date Taking? Authorizing Provider  cetirizine (ZYRTEC ALLERGY) 10 MG tablet Take 1 tablet (10 mg total) by mouth daily. Take daily for seasonal allergies 12/02/20   Hazel Sams, PA-C  ferrous sulfate 325 (65 FE) MG tablet Take 325 mg by mouth daily with breakfast.    [provider]  FIBER FORMULA PO Take 1 tablet by mouth daily.  Patient not taking: Reported on 11/26/2021    [provider]  fluticasone (FLONASE) 50 MCG/ACT nasal spray Place 2 sprays into both nostrils daily. Patient not taking: Reported on 11/04/2021 12/02/20   Hazel Sams, PA-C  ibuprofen (ADVIL) 800 MG tablet Take 1 tablet (800 mg total) by mouth 3 (three) times daily. Patient not taking: Reported on 11/04/2021 10/18/20   Lynden Oxford Scales, PA-C  loperamide (IMODIUM) 2 MG capsule Take 1 capsule (2 mg total) by mouth 4 (four) times daily as needed for diarrhea or loose stools. Patient not taking: Reported on 11/04/2021 12/02/20   Hazel Sams, PA-C  Multiple Vitamins-Minerals (MULTIVITAMIN WITH MINERALS) tablet Take 1  tablet by mouth daily.    [provider]  ondansetron (ZOFRAN ODT) 8 MG disintegrating tablet Take 1 tablet (8 mg total) by mouth every 8 (eight) hours as needed for nausea or vomiting. Patient not taking: Reported on 11/04/2021 12/02/20   Hazel Sams, PA-C  polyethylene glycol (MIRALAX / GLYCOLAX) 17 g packet Take 17 g by mouth daily.    [provider]    Allergies  Allergen Reactions   Pollen Extract     Social History   Tobacco Use   Smoking status: Former    Types: Cigarettes   Smokeless tobacco: Never   Tobacco comments:    it depends on the situation- none in the last week ,   Vaping Use   Vaping Use: Never used  Substance Use Topics   Alcohol use: Not Currently    Alcohol/week: 3.0 standard drinks of alcohol    Types: 2 Glasses of wine, 1 Shots of liquor per week    Comment: occasionally, when she goes out she states,   Drug use: Not Currently    Types: Cocaine, Marijuana    Comment: as of 11/04/21, occasional cocaine pt states    Family History  Problem Relation Age of Onset   Dementia Mother    Colon cancer Neg  Hx    Colon polyps Neg Hx    Esophageal cancer Neg Hx    Rectal cancer Neg Hx    Stomach cancer Neg Hx    Breast cancer Neg Hx     Review of Systems  Constitutional: negative for fevers and chills All other systems reviewed and are negative    Constitutional: in no apparent distress  Vitals:   12/30/21 1528  BP: 138/81  Pulse: 71  Temp: 97.9 F (36.6 C)  SpO2: 99%   EYES: anicteric ENMT: no thrush Respiratory: normal respiratory effort Musculoskeletal: no edema   Labs: Lab Results  Component Value Date   WBC 7.0 07/13/2021   HGB 14.0 07/13/2021   HCT 46.5 (H) 07/13/2021   MCV 80.6 07/13/2021   PLT 239 07/13/2021    Lab Results  Component Value Date   CREATININE 0.89 07/13/2021   BUN 12 07/13/2021   NA 137 07/13/2021   K 4.2 07/13/2021   CL 105 07/13/2021   CO2 22 07/13/2021    Lab Results  Component  Value Date   ALT 22 07/13/2021   AST 41 07/13/2021   ALKPHOS 131 (H) 07/13/2021   BILITOT 0.4 07/13/2021   INR 0.89 05/31/2016     Assessment: chronic active hepatitis C.   I discussed the natural history of hepatitis C, indications for treatment, outcomes and monitoring.   She is interested in treatment so will start the process with Epclusa and get other labs.  She can start when available pending her hepatitis B labs and HIV.  Plan: 1) Epclusa - can start when available.  2) labs Follow up in 3-4 weeks.

## 2021-12-31 ENCOUNTER — Encounter: Payer: Self-pay | Admitting: Internal Medicine

## 2022-01-02 LAB — LIVER FIBROSIS, FIBROTEST-ACTITEST
ALT: 15 U/L (ref 6–29)
Alpha-2-Macroglobulin: 212 mg/dL (ref 106–279)
Apolipoprotein A1: 226 mg/dL — ABNORMAL HIGH (ref 101–198)
Bilirubin: 0.3 mg/dL (ref 0.2–1.2)
Fibrosis Score: 0.07
GGT: 12 U/L (ref 3–65)
Haptoglobin: 124 mg/dL (ref 43–212)
Necroinflammat ACT Score: 0.04
Reference ID: 4663047

## 2022-01-02 LAB — HEPATITIS B CORE ANTIBODY, TOTAL: Hep B Core Total Ab: NONREACTIVE

## 2022-01-02 LAB — HEPATITIS B SURFACE ANTIBODY,QUALITATIVE: Hep B S Ab: REACTIVE — AB

## 2022-01-02 LAB — HEPATITIS B SURFACE ANTIGEN: Hepatitis B Surface Ag: NONREACTIVE

## 2022-01-02 LAB — HIV ANTIBODY (ROUTINE TESTING W REFLEX): HIV 1&2 Ab, 4th Generation: NONREACTIVE

## 2022-01-02 LAB — HEPATITIS A ANTIBODY, TOTAL: Hepatitis A AB,Total: REACTIVE — AB

## 2022-01-05 ENCOUNTER — Ambulatory Visit (HOSPITAL_COMMUNITY)
Admission: RE | Admit: 2022-01-05 | Discharge: 2022-01-05 | Disposition: A | Discharge status: Home / Self Care | Payer: Medicare (Managed Care) | Source: Ambulatory Visit | Attending: Internal Medicine | Admitting: Internal Medicine

## 2022-01-05 DIAGNOSIS — B182 Chronic viral hepatitis C: Secondary | ICD-10-CM

## 2022-01-06 ENCOUNTER — Telehealth: Payer: Self-pay

## 2022-01-06 ENCOUNTER — Other Ambulatory Visit (HOSPITAL_COMMUNITY): Payer: Self-pay

## 2022-01-06 NOTE — Telephone Encounter (Signed)
[facility] Patient Advocate Encounter   Received notification from [place] [employer] that prior authorization for epclusa is required.   PA submitted on 01/06/22 Key [id number] Status is pending    [facility] will continue to follow.   Ileene Patrick, [facility] Patient [facility] for Infectious Disease Phone: [phone] Fax:  [phone]

## 2022-01-07 ENCOUNTER — Telehealth: Payer: Self-pay

## 2022-01-07 ENCOUNTER — Other Ambulatory Visit (HOSPITAL_COMMUNITY): Payer: Self-pay

## 2022-01-07 ENCOUNTER — Other Ambulatory Visit: Payer: Self-pay

## 2022-01-07 NOTE — Telephone Encounter (Signed)
Patient left voicemail requesting call back. Did not disclose reason for call in voicemail. Attempted to call patient back but not able to reach her at this time. Voicemail is full. Leatrice Jewels, RMA

## 2022-01-07 NOTE — Telephone Encounter (Signed)
[facility] Patient Advocate Encounter  Prior Authorization for Raeanne Gathers [employer]) has been approved.    PA# [id number] Effective dates: 12/07/21 through 04/01/22  Patients co-pay is $3,882.47.   I will reach out to the patient to get income information to sign up for [employer].   Prescription will need to be written for BrandName.  [facility] will continue to follow.  Ileene Patrick, [facility] Patient [facility] for [facility]ious Disease Phone: [phone] Fax:  [phone]

## 2022-01-09 ENCOUNTER — Other Ambulatory Visit (HOSPITAL_COMMUNITY): Payer: Self-pay

## 2022-01-12 NOTE — Progress Notes (Signed)
It would probably be better to wait for her new insurance in January in case it doesn't continue to approve what she is on. Too many times we've had people change insurance and they have a lapse in treatment because of this.

## 2022-02-03 ENCOUNTER — Other Ambulatory Visit: Payer: Self-pay | Admitting: Pharmacist

## 2022-02-03 ENCOUNTER — Telehealth: Payer: Self-pay

## 2022-02-03 ENCOUNTER — Other Ambulatory Visit: Payer: Self-pay

## 2022-02-03 ENCOUNTER — Ambulatory Visit: Payer: Medicare (Managed Care) | Admitting: Internal Medicine

## 2022-02-03 ENCOUNTER — Other Ambulatory Visit (HOSPITAL_COMMUNITY): Payer: Self-pay

## 2022-02-03 DIAGNOSIS — B182 Chronic viral hepatitis C: Secondary | ICD-10-CM

## 2022-02-03 MED ORDER — LEDIPASVIR-SOFOSBUVIR 90-400 MG PO TABS
1.0000 | ORAL_TABLET | Freq: Every day | ORAL | 1 refills | Status: AC
  Filled 2022-02-03 (×2): qty 28, 28d supply, fill #0
  Filled 2022-03-02: qty 28, 28d supply, fill #1

## 2022-02-03 NOTE — Telephone Encounter (Signed)
[facility] Patient Advocate Encounter  Prior Authorization for Bayard Males has been approved.    PA# [id number] Effective dates: 02/03/22 through 04/28/22  Patients co-pay is $11.20.   Prescription can be sent to [facility].  [facility] will continue to follow.  Ileene Patrick, [facility] Patient [facility] for Infectious Disease Phone: [phone] Fax:  [phone]

## 2022-02-03 NOTE — Telephone Encounter (Signed)
[facility] Patient Advocate Encounter   Received notification from [place] that prior authorization for Mallory Bailey is required.   PA submitted on 02/03/22 Key [id number] Status is pending    [facility] will continue to follow.   Mallory Bailey, [facility] Patient [facility] for Infectious Disease Phone: [phone] Fax:  [phone]

## 2022-02-04 ENCOUNTER — Other Ambulatory Visit: Payer: Self-pay

## 2022-02-04 ENCOUNTER — Other Ambulatory Visit (HOSPITAL_COMMUNITY): Payer: Self-pay

## 2022-02-05 ENCOUNTER — Other Ambulatory Visit: Payer: Medicare (Managed Care)

## 2022-02-06 ENCOUNTER — Telehealth: Payer: Self-pay

## 2022-02-06 NOTE — Telephone Encounter (Signed)
[facility] Patient Advocate Encounter  Patient's medications have been couriered to [facility] from [place] and will be picked up 02/11/22.  1st Harvoni Box.  Mallory Bailey , [facility] Patient [facility] for Infectious Disease Phone: [phone] Fax:  [phone]

## 2022-02-11 ENCOUNTER — Other Ambulatory Visit: Payer: Self-pay

## 2022-02-11 ENCOUNTER — Encounter: Payer: Self-pay | Admitting: Internal Medicine

## 2022-02-11 ENCOUNTER — Ambulatory Visit (INDEPENDENT_AMBULATORY_CARE_PROVIDER_SITE_OTHER): Payer: Medicare PPO | Admitting: Internal Medicine

## 2022-02-11 ENCOUNTER — Telehealth: Payer: Self-pay | Admitting: Pharmacist

## 2022-02-11 VITALS — BP 146/85 | HR 82 | Resp 16 | Ht 65.5 in | Wt 146.0 lb

## 2022-02-11 DIAGNOSIS — B182 Chronic viral hepatitis C: Secondary | ICD-10-CM

## 2022-02-11 NOTE — Assessment & Plan Note (Signed)
She is ready for treatment and will start today Labs and ultrasound results reviewed with her today Follow up in about 6 weeks to recheck labs   I have personally spent 30 minutes involved in face-to-face and non-face-to-face activities for this patient on the day of the visit. Professional time spent includes the following activities: Preparing to see the patient (review of tests), Obtaining and/or reviewing separately obtained history (admission/discharge record), Performing a medically appropriate examination and/or evaluation , Ordering medications/tests/procedures, referring and communicating with other health care professionals, Documenting clinical information in the EMR, Independently interpreting results (not separately reported), Communicating results to the patient/family/caregiver, Counseling and educating the patient/family/caregiver and Care coordination (not separately reported).

## 2022-02-11 NOTE — Progress Notes (Signed)
   Subjective:    Patient ID: Mallory Bailey, female    DOB: 05-06-1955, 67 y.o.   MRN: [id number]  HPI She is here for follow up of chronic hepatitis C. She is doing well and now has Harvoni approved to start today. She is pleased with being able to start treatment.  No current fatigue or concerns.     Review of Systems  Constitutional:  Negative for fatigue.  Gastrointestinal:  Negative for diarrhea.  Skin:  Negative for rash.       Objective:   Physical Exam Eyes:     General: No scleral icterus. Pulmonary:     Effort: Pulmonary effort is normal.  Skin:    Findings: No rash.  Neurological:     Mental Status: She is alert.   SH: no alcohol        Assessment & Plan:

## 2022-02-11 NOTE — Telephone Encounter (Signed)
Patient is approved to receive Harvoni x 8 weeks for chronic Hepatitis C infection. Counseled patient to take Harvoni daily with or without food. Encouraged patient not to miss any doses and explained how their chance of cure could go down with each dose missed.  Counseled patient on what to do if dose is missed - if it is closer to the missed dose take immediately; if closer to next dose then skip dose and take the next dose at the usual time. Counseled patient on common side effects such as headache, fatigue, and nausea and that these normally decrease with time. I reviewed patient medications and found no interactions. Discussed with patient that there are several drug interactions including acid suppressants. Instructed patient to call clinic if she wishes to start a new medication during course of therapy. Also advised patient to call if she experiences side effects. Patient will follow-up with Dr. Linus Salmons on 03/10/22.  Alfonse Spruce, PharmD, CPP, BCIDP, [facility] Clinical Pharmacist Practitioner Infect[facility] [place] [facility]

## 2022-02-18 ENCOUNTER — Ambulatory Visit: Payer: Medicare (Managed Care) | Admitting: Pharmacist

## 2022-02-24 ENCOUNTER — Emergency Department (HOSPITAL_COMMUNITY): Payer: Medicare PPO

## 2022-02-24 ENCOUNTER — Emergency Department (HOSPITAL_COMMUNITY)
Admission: EM | Admit: 2022-02-24 | Discharge: 2022-02-24 | Discharge status: Against Medical Advice | Payer: Medicare PPO | Attending: Emergency Medicine | Admitting: Emergency Medicine

## 2022-02-24 DIAGNOSIS — Z5321 Procedure and treatment not carried out due to patient leaving prior to being seen by health care provider: Secondary | ICD-10-CM | POA: Diagnosis not present

## 2022-02-24 DIAGNOSIS — R110 Nausea: Secondary | ICD-10-CM | POA: Diagnosis not present

## 2022-02-24 DIAGNOSIS — R531 Weakness: Secondary | ICD-10-CM | POA: Diagnosis not present

## 2022-02-24 DIAGNOSIS — H538 Other visual disturbances: Secondary | ICD-10-CM | POA: Insufficient documentation

## 2022-02-24 DIAGNOSIS — R519 Headache, unspecified: Secondary | ICD-10-CM | POA: Diagnosis not present

## 2022-02-24 LAB — COMPREHENSIVE METABOLIC PANEL
ALT: 8 U/L (ref 0–44)
AST: 22 U/L (ref 15–41)
Albumin: 3.8 g/dL (ref 3.5–5.0)
Alkaline Phosphatase: 131 U/L — ABNORMAL HIGH (ref 38–126)
Anion gap: 8 (ref 5–15)
BUN: 16 mg/dL (ref 8–23)
CO2: 25 mmol/L (ref 22–32)
Calcium: 9.2 mg/dL (ref 8.9–10.3)
Chloride: 105 mmol/L (ref 98–111)
Creatinine, Ser: 1.14 mg/dL — ABNORMAL HIGH (ref 0.44–1.00)
GFR, Estimated: 52 mL/min — ABNORMAL LOW (ref >60)
Glucose, Bld: 92 mg/dL (ref 70–99)
Potassium: 4.3 mmol/L (ref 3.5–5.1)
Sodium: 138 mmol/L (ref 135–145)
Total Bilirubin: 0.4 mg/dL (ref 0.3–1.2)
Total Protein: 7.7 g/dL (ref 6.5–8.1)

## 2022-02-24 LAB — CBC WITH DIFFERENTIAL/PLATELET
Abs Immature Granulocytes: 0.02 10*3/uL (ref 0.00–0.07)
Basophils Absolute: 0.1 10*3/uL (ref 0.0–0.1)
Basophils Relative: 1 %
Eosinophils Absolute: 0.2 10*3/uL (ref 0.0–0.5)
Eosinophils Relative: 2 %
HCT: 47.3 % — ABNORMAL HIGH (ref 36.0–46.0)
Hemoglobin: 14.3 g/dL (ref 12.0–15.0)
Immature Granulocytes: 0 %
Lymphocytes Relative: 36 %
Lymphs Abs: 2.9 10*3/uL (ref 0.7–4.0)
MCH: 24 pg — ABNORMAL LOW (ref 26.0–34.0)
MCHC: 30.2 g/dL (ref 30.0–36.0)
MCV: 79.2 fL — ABNORMAL LOW (ref 80.0–100.0)
Monocytes Absolute: 0.6 10*3/uL (ref 0.1–1.0)
Monocytes Relative: 8 %
Neutro Abs: 4.1 10*3/uL (ref 1.7–7.7)
Neutrophils Relative %: 53 %
Platelets: 254 10*3/uL (ref 150–400)
RBC: 5.97 MIL/uL — ABNORMAL HIGH (ref 3.87–5.11)
RDW: 16.2 % — ABNORMAL HIGH (ref 11.5–15.5)
WBC: 7.9 10*3/uL (ref 4.0–10.5)
nRBC: 0 % (ref 0.0–0.2)

## 2022-02-24 LAB — LIPASE, BLOOD: Lipase: 35 U/L (ref 11–51)

## 2022-02-24 NOTE — ED Provider Triage Note (Signed)
Emergency Medicine Provider Triage Evaluation Note  Mallory Bailey , a 67 y.o. female  was evaluated in triage.  Pt complains of headaches and vertigo.  Patient reports he has been having a sensation of headaches on her right side and feelings of generalized weakness with some room spinning for the last year or so.  She reports these episodes are becoming more frequent and more severe.  Most reported an episode of vertigo resulting in patient having a mechanical fall when she tried to stand denies any head trauma or any pain lingering in chest or arms.  She has some associated nausea.  Denies any fever, chest pain, shortness of breath, abdominal pain, vomiting, diarrhea.  She was recently diagnosed with hepatitis C about 2 weeks ago and started on antiviral medications..  Review of Systems  Positive: As above Negative: As above  Physical Exam  BP (!) 147/104   Pulse 83   Temp 98.3 F (36.8 C)   Resp 16   Ht '5\' 6"'$  (1.676 m)   Wt 66.7 kg   SpO2 100%   BMI 23.73 kg/m  Gen:   Awake, no distress   Resp:  Normal effort  MSK:   Moves extremities without difficulty  Other:  PERRL, EOMs intact  Medical Decision Making  Medically screening exam initiated at 2:25 PM.  Appropriate orders placed.  Mallory Bailey was informed that the remainder of the evaluation will be completed by another provider, this initial triage assessment does not replace that evaluation, and the importance of remaining in the ED until their evaluation is complete.     Luvenia Heller, PA-C 02/24/22 1426

## 2022-02-24 NOTE — ED Triage Notes (Signed)
C/o intermittent headache x3 months with nausea and blurred vision.  Fall x2 in past 4 days per patient from the headaches.  Denies LOC and hitting head Denies pain.

## 2022-02-26 ENCOUNTER — Other Ambulatory Visit (HOSPITAL_COMMUNITY): Payer: Self-pay

## 2022-03-02 ENCOUNTER — Other Ambulatory Visit (HOSPITAL_COMMUNITY): Payer: Self-pay

## 2022-03-03 ENCOUNTER — Other Ambulatory Visit: Payer: Self-pay

## 2022-03-03 ENCOUNTER — Other Ambulatory Visit (HOSPITAL_COMMUNITY): Payer: Self-pay

## 2022-03-04 ENCOUNTER — Telehealth: Payer: Self-pay

## 2022-03-04 NOTE — Telephone Encounter (Signed)
[facility] Patient Advocate Encounter  Patient's medications have been couriered to [facility] from [place]: [phone] , and will be picked up on  03/10/2022.

## 2022-03-09 ENCOUNTER — Other Ambulatory Visit: Payer: Self-pay

## 2022-03-09 ENCOUNTER — Ambulatory Visit (INDEPENDENT_AMBULATORY_CARE_PROVIDER_SITE_OTHER): Payer: Medicare PPO | Admitting: Pharmacist

## 2022-03-09 DIAGNOSIS — B182 Chronic viral hepatitis C: Secondary | ICD-10-CM | POA: Diagnosis not present

## 2022-03-09 NOTE — Progress Notes (Signed)
03/09/2022  HPI: Mallory Bailey is a 67 y.o. female who presents to the [facility] for Hepatitis C follow-up.  Medication: Harvoni x 8 weeks  Start Date: 02/11/22  Hepatitis C Genotype: 1b  Fibrosis Score: F0  Hepatitis C RNA: 2.8 million on 11/21/22  Patient Active Problem List   Diagnosis Date Noted   Chronic hepatitis C without hepatic coma ([place]) 12/30/2021   Iron deficiency 12/16/2015   Nerve pain 05/04/2014   Dental abscess 05/04/2014   Seasonal allergies 05/04/2014    Patient's Medications  New Prescriptions   No medications on file  Previous Medications   CETIRIZINE (ZYRTEC ALLERGY) 10 MG TABLET    Take 1 tablet (10 mg total) by mouth daily. Take daily for seasonal allergies   FERROUS SULFATE 325 (65 FE) MG TABLET    Take 325 mg by mouth daily with breakfast.   FIBER FORMULA PO    Take 1 tablet by mouth daily.    FLUTICASONE (FLONASE) 50 MCG/ACT NASAL SPRAY    Place 2 sprays into both nostrils daily.   IBUPROFEN (ADVIL) 800 MG TABLET    Take 1 tablet (800 mg total) by mouth 3 (three) times daily.   LEDIPASVIR-SOFOSBUVIR (HARVONI) 90-400 MG TABS    Take 1 tablet by mouth daily.   LOPERAMIDE (IMODIUM) 2 MG CAPSULE    Take 1 capsule (2 mg total) by mouth 4 (four) times daily as needed for diarrhea or loose stools.   MULTIPLE VITAMINS-MINERALS (MULTIVITAMIN WITH MINERALS) TABLET    Take 1 tablet by mouth daily.   ONDANSETRON (ZOFRAN ODT) 8 MG DISINTEGRATING TABLET    Take 1 tablet (8 mg total) by mouth every 8 (eight) hours as needed for nausea or vomiting.   POLYETHYLENE GLYCOL (MIRALAX / GLYCOLAX) 17 G PACKET    Take 17 g by mouth daily.  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 for TB, told has the "germ: per pt,    Substance abuse ([place])     Social History: Social History   Socioeconomic History   Marital status: Divorced    Spouse name: Not on file   Number of children: 1   Years of education: 14   Highest education level: Not on file  Occupational History   Occupation: Therapist, art  Tobacco Use   Smoking status: Former    Types: Cigarettes   Smokeless tobacco: Never   Tobacco comments:    it depends on the situation- none in the last week ,   Vaping Use   Vaping Use: Never used  Substance and Sexual Activity   Alcohol use: Not Currently    Alcohol/week: 3.0 standard drinks of alcohol    Types: 2 Glasses of wine, 1 Shots of liquor per week    Comment: occasionally, when she goes out she states,   Drug use: Not Currently    Types: Cocaine, Marijuana    Comment: as of 11/04/21, occasional cocaine pt states   Sexual activity: Not Currently  Other Topics Concern   Not on file  Social History  Narrative   Fun: Being with her grandkids   Denies religious beliefs effecting health care.    Social Determinants of Health   Financial Resource Strain: Not on file  Food Insecurity: Not on file  Transportation Needs: Not on file  Physical Activity: Not on file  Stress: Not on file  Social Connections: Not on file    Labs: Hepatitis C Lab Results  Component Value Date   Bennie Pierini 12/30/2021   Hepatitis B Lab Results  Component Value Date   HEPBSAB REACTIVE (A) 12/30/2021   HEPBSAG NON-REACTIVE 12/30/2021   HEPBCAB NON-REACTIVE 12/30/2021   Hepatitis A Lab Results  Component Value Date   HAV REACTIVE (A) 12/30/2021   HIV Lab Results  Component Value Date   HIV NON-REACTIVE 12/30/2021   Lab Results  Component Value Date   CREATININE 1.14 (H) 02/24/2022   CREATININE 0.89 07/13/2021   CREATININE 1.01 (H) 03/08/2020   CREATININE 0.92 05/31/2016   CREATININE 0.96 12/16/2015   Lab Results   Component Value Date   AST 22 02/24/2022   AST 41 07/13/2021   AST 28 03/08/2020   ALT 8 02/24/2022   ALT 15 12/30/2021   ALT 22 07/13/2021   INR 0.89 05/31/2016    Assessment: Mallory Bailey is here today to follow up for her Hepatitis C infection. She started on 8 weeks of Harvoni back at the beginning of January. She is tolerating it decently but is having headaches and fatigue. She suffered from headaches prior to starting the [place] and states that they have been worse since starting. I explained that headaches and fatigue were normal side effects associated with Harvoni and to try and power through the remainder of treatment. She has missed 3 doses due to forgetfulness. She usually takes it first thing when she wakes up. She does not use a pillbox.  I explained that her chance of cure goes down with each dose missed and asked her to try and not miss anymore doses during the rest of treatment. She will try her best. I gave her the 2nd and final month of medication in clinic today. She has received the pneumococcal vaccine and has documented immunity to Hepatitis A and B already.  Will check her Hepatitis C RNA and have her see Dr. Linus Salmons at the end of treatment.   Plan: - Continue Harvoni for 8 weeks total - medication provided to her in clinic today - Hep C RNA - F/u with Dr. Linus Salmons on 04/16/22  Mallory Bailey, PharmD, BCIDP, AAHIVP, CPP Clinical Pharmacist Practitioner Infectious Diseases [place] for Infectious Disease 03/09/2022, 10:00 AM

## 2022-03-10 ENCOUNTER — Ambulatory Visit: Payer: Medicare PPO | Admitting: Pharmacist

## 2022-03-10 ENCOUNTER — Ambulatory Visit: Payer: Medicare PPO | Admitting: Internal Medicine

## 2022-03-11 LAB — HEPATITIS C RNA QUANTITATIVE
HCV Quantitative Log: 1.9 log IU/mL — ABNORMAL HIGH
HCV RNA, PCR, QN: 79 IU/mL — ABNORMAL HIGH

## 2022-03-13 ENCOUNTER — Other Ambulatory Visit: Payer: Medicare (Managed Care)

## 2022-03-20 ENCOUNTER — Encounter: Payer: Self-pay | Admitting: Gastroenterology

## 2022-03-24 ENCOUNTER — Other Ambulatory Visit (HOSPITAL_COMMUNITY): Payer: Self-pay

## 2022-03-27 ENCOUNTER — Other Ambulatory Visit (HOSPITAL_COMMUNITY): Payer: Self-pay

## 2022-03-31 ENCOUNTER — Ambulatory Visit: Payer: Medicare PPO | Admitting: Internal Medicine

## 2022-04-16 ENCOUNTER — Ambulatory Visit: Payer: Medicare PPO | Admitting: Internal Medicine

## 2022-04-30 ENCOUNTER — Other Ambulatory Visit: Payer: Medicare (Managed Care)

## 2022-05-05 ENCOUNTER — Ambulatory Visit: Payer: Medicare PPO | Admitting: Internal Medicine

## 2022-05-13 ENCOUNTER — Ambulatory Visit: Payer: Medicare PPO | Admitting: Pharmacist

## 2022-05-13 NOTE — Progress Notes (Unsigned)
05/13/2022  HPI: Mallory Bailey is a 67 y.o. female who presents to the [facility] pharmacy clinic for Hepatitis C follow-up.  Medication: Harvoni x 8 weeks  Start Date: 02/11/2022  Hepatitis C Genotype: 1b  Fibrosis Score: F0  Hepatitis C RNA: 2.8 million on 11/21/2022  Patient Active Problem List   Diagnosis Date Noted   Chronic hepatitis C without hepatic coma 12/30/2021   Iron deficiency 12/16/2015   Nerve pain 05/04/2014   Dental abscess 05/04/2014   Seasonal allergies 05/04/2014    Patient's Medications  New Prescriptions   No medications on file  Previous Medications   CETIRIZINE (ZYRTEC ALLERGY) 10 MG TABLET    Take 1 tablet (10 mg total) by mouth daily. Take daily for seasonal allergies   FERROUS SULFATE 325 (65 FE) MG TABLET    Take 325 mg by mouth daily with breakfast.   FIBER FORMULA PO    Take 1 tablet by mouth daily.    FLUTICASONE (FLONASE) 50 MCG/ACT NASAL SPRAY    Place 2 sprays into both nostrils daily.   IBUPROFEN (ADVIL) 800 MG TABLET    Take 1 tablet (800 mg total) by mouth 3 (three) times daily.   LEDIPASVIR-SOFOSBUVIR (HARVONI) 90-400 MG TABS    Take 1 tablet by mouth daily.   LOPERAMIDE (IMODIUM) 2 MG CAPSULE    Take 1 capsule (2 mg total) by mouth 4 (four) times daily as needed for diarrhea or loose stools.   MULTIPLE VITAMINS-MINERALS (MULTIVITAMIN WITH MINERALS) TABLET    Take 1 tablet by mouth daily.   ONDANSETRON (ZOFRAN ODT) 8 MG DISINTEGRATING TABLET    Take 1 tablet (8 mg total) by mouth every 8 (eight) hours as needed for nausea or vomiting.   POLYETHYLENE GLYCOL (MIRALAX / GLYCOLAX) 17 G PACKET    Take 17 g by mouth daily.  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 for TB, told has the "germ: per pt,    Substance abuse (HCC)     Social History: Social History   Socioeconomic History   Marital status: Divorced    Spouse name: Not on file   Number of children: 1   Years of education: 14   Highest education level: Not on file  Occupational History   Occupation: Clinical biochemist  Tobacco Use   Smoking status: Former    Types: Cigarettes   Smokeless tobacco: Never   Tobacco comments:    it depends on the situation- none in the last week ,   Vaping Use   Vaping Use: Never used  Substance and Sexual Activity   Alcohol use: Not Currently    Alcohol/week: 3.0 standard drinks of alcohol    Types: 2 Glasses of wine, 1 Shots of liquor per week    Comment: occasionally, when she goes out she states,   Drug use: Not Currently    Types: Cocaine, Marijuana    Comment: as of 11/04/21, occasional cocaine pt states   Sexual activity: Not Currently  Other Topics Concern   Not on file  Social History Narrative  Fun: Being with her grandkids   Denies religious beliefs effecting health care.    Social Determinants of Health   Financial Resource Strain: Not on file  Food Insecurity: Not on file  Transportation Needs: Not on file  Physical Activity: Not on file  Stress: Not on file  Social Connections: Not on file    Labs: Hepatitis C Lab Results  Component Value Date   HCVRNAPCRQN 79 (H) 03/09/2022   FIBROSTAGE F0 12/30/2021   Hepatitis B Lab Results  Component Value Date   HEPBSAB REACTIVE (A) 12/30/2021   HEPBSAG NON-REACTIVE 12/30/2021   HEPBCAB NON-REACTIVE 12/30/2021   Hepatitis A Lab Results  Component Value Date   HAV REACTIVE (A) 12/30/2021   HIV Lab Results  Component Value Date   HIV NON-REACTIVE 12/30/2021   Lab Results  Component Value Date   CREATININE 1.14 (H) 02/24/2022   CREATININE 0.89 07/13/2021   CREATININE 1.01 (H) 03/08/2020   CREATININE 0.92 05/31/2016   CREATININE  0.96 12/16/2015   Lab Results  Component Value Date   AST 22 02/24/2022   AST 41 07/13/2021   AST 28 03/08/2020   ALT 8 02/24/2022   ALT 15 12/30/2021   ALT 22 07/13/2021   INR 0.89 05/31/2016    Assessment: ***   Plan: Check hepatitis C RNA today   Blane Ohara, PharmD  PGY1 Pharmacy Resident

## 2022-05-19 ENCOUNTER — Ambulatory Visit: Payer: Medicare PPO | Admitting: Pharmacist

## 2022-05-19 NOTE — Progress Notes (Unsigned)
05/19/2022  HPI: Mallory Bailey is a 67 y.o. female who presents to the [facility] pharmacy clinic for Hepatitis C follow-up.  Medication: Harvoni x 8 weeks  Start Date: January 10th, 2024   Hepatitis C Genotype: 1b  Fibrosis Score: F0  Hepatitis C RNA: 79  Patient Active Problem List   Diagnosis Date Noted   Chronic hepatitis C without hepatic coma 12/30/2021   Iron deficiency 12/16/2015   Nerve pain 05/04/2014   Dental abscess 05/04/2014   Seasonal allergies 05/04/2014    Patient's Medications  New Prescriptions   No medications on file  Previous Medications   CETIRIZINE (ZYRTEC ALLERGY) 10 MG TABLET    Take 1 tablet (10 mg total) by mouth daily. Take daily for seasonal allergies   FERROUS SULFATE 325 (65 FE) MG TABLET    Take 325 mg by mouth daily with breakfast.   FIBER FORMULA PO    Take 1 tablet by mouth daily.    FLUTICASONE (FLONASE) 50 MCG/ACT NASAL SPRAY    Place 2 sprays into both nostrils daily.   IBUPROFEN (ADVIL) 800 MG TABLET    Take 1 tablet (800 mg total) by mouth 3 (three) times daily.   LEDIPASVIR-SOFOSBUVIR (HARVONI) 90-400 MG TABS    Take 1 tablet by mouth daily.   LOPERAMIDE (IMODIUM) 2 MG CAPSULE    Take 1 capsule (2 mg total) by mouth 4 (four) times daily as needed for diarrhea or loose stools.   MULTIPLE VITAMINS-MINERALS (MULTIVITAMIN WITH MINERALS) TABLET    Take 1 tablet by mouth daily.   ONDANSETRON (ZOFRAN ODT) 8 MG DISINTEGRATING TABLET    Take 1 tablet (8 mg total) by mouth every 8 (eight) hours as needed for nausea or vomiting.   POLYETHYLENE GLYCOL (MIRALAX / GLYCOLAX) 17 G PACKET    Take 17 g by mouth daily.  Modified Medications   No medications on file  Discontinued Medications   No medications on file    Allergies: Allergies  Allergen Reactions   Pollen Extract     Past Medical History: Past Medical History:  Diagnosis Date   Allergy    seasonal   Anemia    Cataract    forming    Chicken pox    Insomnia    Positive TB  test    never been tx for TB, told has the "germ: per pt,    Substance abuse (HCC)     Social History: Social History   Socioeconomic History   Marital status: Divorced    Spouse name: Not on file   Number of children: 1   Years of education: 14   Highest education level: Not on file  Occupational History   Occupation: Clinical biochemist  Tobacco Use   Smoking status: Former    Types: Cigarettes   Smokeless tobacco: Never   Tobacco comments:    it depends on the situation- none in the last week ,   Vaping Use   Vaping Use: Never used  Substance and Sexual Activity   Alcohol use: Not Currently    Alcohol/week: 3.0 standard drinks of alcohol    Types: 2 Glasses of wine, 1 Shots of liquor per week    Comment: occasionally, when she goes out she states,   Drug use: Not Currently    Types: Cocaine, Marijuana    Comment: as of 11/04/21, occasional cocaine pt states   Sexual activity: Not Currently  Other Topics Concern   Not on file  Social History Narrative  Fun: Being with her grandkids   Denies religious beliefs effecting health care.    Social Determinants of Health   Financial Resource Strain: Not on file  Food Insecurity: Not on file  Transportation Needs: Not on file  Physical Activity: Not on file  Stress: Not on file  Social Connections: Not on file    Labs: Hepatitis C Lab Results  Component Value Date   HCVRNAPCRQN 79 (H) 03/09/2022   FIBROSTAGE F0 12/30/2021   Hepatitis B Lab Results  Component Value Date   HEPBSAB REACTIVE (A) 12/30/2021   HEPBSAG NON-REACTIVE 12/30/2021   HEPBCAB NON-REACTIVE 12/30/2021   Hepatitis A Lab Results  Component Value Date   HAV REACTIVE (A) 12/30/2021   HIV Lab Results  Component Value Date   HIV NON-REACTIVE 12/30/2021   Lab Results  Component Value Date   CREATININE 1.14 (H) 02/24/2022   CREATININE 0.89 07/13/2021   CREATININE 1.01 (H) 03/08/2020   CREATININE 0.92 05/31/2016   CREATININE 0.96  12/16/2015   Lab Results  Component Value Date   AST 22 02/24/2022   AST 41 07/13/2021   AST 28 03/08/2020   ALT 8 02/24/2022   ALT 15 12/30/2021   ALT 22 07/13/2021   INR 0.89 05/31/2016    Assessment: Mallory Bailey is here today to follow up for her Hepatitis C infection. She started on 8 weeks of Harvoni back at the beginning of January and completed the course in March.   She is hepatitis A and B immune.    Plan: ***  Blane Ohara, PharmD  PGY1 Pharmacy Resident

## 2022-05-27 ENCOUNTER — Ambulatory Visit: Payer: Medicare PPO | Admitting: Pharmacist

## 2022-06-04 ENCOUNTER — Other Ambulatory Visit: Payer: Self-pay

## 2022-06-04 ENCOUNTER — Other Ambulatory Visit (HOSPITAL_COMMUNITY)
Admission: RE | Admit: 2022-06-04 | Discharge: 2022-06-04 | Disposition: A | Discharge status: Home / Self Care | Payer: Medicare PPO | Source: Ambulatory Visit | Attending: Internal Medicine | Admitting: Internal Medicine

## 2022-06-04 ENCOUNTER — Ambulatory Visit (INDEPENDENT_AMBULATORY_CARE_PROVIDER_SITE_OTHER): Payer: Medicare PPO | Admitting: Pharmacist

## 2022-06-04 DIAGNOSIS — B182 Chronic viral hepatitis C: Secondary | ICD-10-CM | POA: Diagnosis not present

## 2022-06-04 DIAGNOSIS — Z113 Encounter for screening for infections with a predominantly sexual mode of transmission: Secondary | ICD-10-CM | POA: Diagnosis present

## 2022-06-04 NOTE — Progress Notes (Addendum)
HPI: Mallory Bailey is a 67 y.o. female who presents to the [facility] pharmacy clinic for Hepatitis C follow-up.  Medication: Harvoni x 8 weeks   Start Date: 02/11/22  Hepatitis C Genotype: 1b  Fibrosis Score: F0  Hepatitis C RNA: 2.8 million (10/23) > undetectable (2/24)   Patient Active Problem List   Diagnosis Date Noted   Chronic hepatitis C without hepatic coma (HCC) 12/30/2021   Iron deficiency 12/16/2015   Nerve pain 05/04/2014   Dental abscess 05/04/2014   Seasonal allergies 05/04/2014    Patient's Medications  New Prescriptions   No medications on file  Previous Medications   CETIRIZINE (ZYRTEC ALLERGY) 10 MG TABLET    Take 1 tablet (10 mg total) by mouth daily. Take daily for seasonal allergies   FERROUS SULFATE 325 (65 FE) MG TABLET    Take 325 mg by mouth daily with breakfast.   FIBER FORMULA PO    Take 1 tablet by mouth daily.    FLUTICASONE (FLONASE) 50 MCG/ACT NASAL SPRAY    Place 2 sprays into both nostrils daily.   IBUPROFEN (ADVIL) 800 MG TABLET    Take 1 tablet (800 mg total) by mouth 3 (three) times daily.   LEDIPASVIR-SOFOSBUVIR (HARVONI) 90-400 MG TABS    Take 1 tablet by mouth daily.   LOPERAMIDE (IMODIUM) 2 MG CAPSULE    Take 1 capsule (2 mg total) by mouth 4 (four) times daily as needed for diarrhea or loose stools.   MULTIPLE VITAMINS-MINERALS (MULTIVITAMIN WITH MINERALS) TABLET    Take 1 tablet by mouth daily.   ONDANSETRON (ZOFRAN ODT) 8 MG DISINTEGRATING TABLET    Take 1 tablet (8 mg total) by mouth every 8 (eight) hours as needed for nausea or vomiting.   POLYETHYLENE GLYCOL (MIRALAX / GLYCOLAX) 17 G PACKET    Take 17 g by mouth daily.  Modified Medications   No medications on file  Discontinued Medications   No medications on file    Allergies: Allergies  Allergen Reactions   Pollen Extract     Past Medical History: Past Medical History:  Diagnosis Date   Allergy    seasonal   Anemia    Cataract    forming    Chicken pox     Insomnia    Positive TB test    never been tx for TB, told has the "germ: per pt,    Substance abuse (HCC)     Social History: Social History   Socioeconomic History   Marital status: Divorced    Spouse name: Not on file   Number of children: 1   Years of education: 14   Highest education level: Not on file  Occupational History   Occupation: Clinical biochemist  Tobacco Use   Smoking status: Former    Types: Cigarettes   Smokeless tobacco: Never   Tobacco comments:    it depends on the situation- none in the last week ,   Vaping Use   Vaping Use: Never used  Substance and Sexual Activity   Alcohol use: Not Currently    Alcohol/week: 3.0 standard drinks of alcohol    Types: 2 Glasses of wine, 1 Shots of liquor per week    Comment: occasionally, when she goes out she states,   Drug use: Not Currently    Types: Cocaine, Marijuana    Comment: as of 11/04/21, occasional cocaine pt states   Sexual activity: Not Currently  Other Topics Concern   Not on file  Social History Narrative   Fun: Being with her grandkids   Denies religious beliefs effecting health care.    Social Determinants of Health   Financial Resource Strain: Not on file  Food Insecurity: Not on file  Transportation Needs: Not on file  Physical Activity: Not on file  Stress: Not on file  Social Connections: Not on file    Labs: Hepatitis C Lab Results  Component Value Date   HCVRNAPCRQN 79 (H) 03/09/2022   FIBROSTAGE F0 12/30/2021   Hepatitis B Lab Results  Component Value Date   HEPBSAB REACTIVE (A) 12/30/2021   HEPBSAG NON-REACTIVE 12/30/2021   HEPBCAB NON-REACTIVE 12/30/2021   Hepatitis A Lab Results  Component Value Date   HAV REACTIVE (A) 12/30/2021   HIV Lab Results  Component Value Date   HIV NON-REACTIVE 12/30/2021   Lab Results  Component Value Date   CREATININE 1.14 (H) 02/24/2022   CREATININE 0.89 07/13/2021   CREATININE 1.01 (H) 03/08/2020   CREATININE 0.92 05/31/2016    CREATININE 0.96 12/16/2015   Lab Results  Component Value Date   AST 22 02/24/2022   AST 41 07/13/2021   AST 28 03/08/2020   ALT 8 02/24/2022   ALT 15 12/30/2021   ALT 22 07/13/2021   INR 0.89 05/31/2016    Assessment: [name] presents to clinic today for HCV follow-up. She completed Harvoni several weeks ago and states she forgot 3 doses during her last month of treatment. Denied any further issues with side effects such as headache and fatigue. She stated she missed several appointments because she was in a toxic relationship that was impacting her health. Will check HCV RNA today and have her follow up with Dr. Luciana Axe soon for her cure visit.   She stated she recently had sex with a new partner and wanted STI testing. An RPR and HIV test were assessed at the ED yesterday and were both negative. She denies any symptoms of an STI. Will check oral/urine cytologies today for gonorrhea and chlamydia.   Did not discuss vaccines during this visit.    Plan: Check HCV RNA and urine/oral cytologies Follow up with Dr. Luciana Axe on 7/30 for cure visit   Margarite Gouge, PharmD, CPP, BCIDP, AAHIVP Clinical Pharmacist Practitioner Infectious Diseases Clinical Pharmacist [facility] 06/04/2022, 11:05 AM

## 2022-06-05 LAB — CYTOLOGY, (ORAL, ANAL, URETHRAL) ANCILLARY ONLY
Chlamydia: NEGATIVE
Comment: NEGATIVE
Comment: NORMAL
Neisseria Gonorrhea: NEGATIVE

## 2022-06-05 LAB — URINE CYTOLOGY ANCILLARY ONLY
Chlamydia: NEGATIVE
Comment: NEGATIVE
Comment: NORMAL
Neisseria Gonorrhea: NEGATIVE

## 2022-06-06 LAB — HEPATITIS C RNA QUANTITATIVE
HCV Quantitative Log: <1.18 log IU/mL
HCV RNA, PCR, QN: <15 IU/mL

## 2022-06-08 ENCOUNTER — Other Ambulatory Visit: Payer: Self-pay

## 2022-06-08 ENCOUNTER — Ambulatory Visit
Admission: RE | Admit: 2022-06-08 | Discharge: 2022-06-08 | Disposition: A | Discharge status: Home / Self Care | Payer: Medicare PPO | Source: Ambulatory Visit | Attending: Family | Admitting: Family

## 2022-06-08 DIAGNOSIS — M7989 Other specified soft tissue disorders: Secondary | ICD-10-CM

## 2022-06-17 ENCOUNTER — Ambulatory Visit
Admission: RE | Admit: 2022-06-17 | Discharge: 2022-06-17 | Disposition: A | Discharge status: Home / Self Care | Payer: Medicare PPO | Source: Ambulatory Visit | Attending: Family | Admitting: Family

## 2022-06-17 DIAGNOSIS — Z13820 Encounter for screening for osteoporosis: Secondary | ICD-10-CM

## 2022-09-01 ENCOUNTER — Encounter: Payer: Self-pay | Admitting: Internal Medicine

## 2022-09-01 ENCOUNTER — Ambulatory Visit (INDEPENDENT_AMBULATORY_CARE_PROVIDER_SITE_OTHER): Payer: Medicare PPO | Admitting: Internal Medicine

## 2022-09-01 ENCOUNTER — Other Ambulatory Visit: Payer: Self-pay

## 2022-09-01 VITALS — BP 134/84 | HR 71 | Resp 16 | Ht 66.0 in | Wt 147.0 lb

## 2022-09-01 DIAGNOSIS — B182 Chronic viral hepatitis C: Secondary | ICD-10-CM | POA: Diagnosis not present

## 2022-09-01 NOTE — Progress Notes (Signed)
   Subjective:    Patient ID: Mallory Bailey, female    DOB: 1955-12-17, 67 y.o.   MRN: [id number]  HPI Mallory Bailey is here for follow up of chronic hepatitis C She has genotype 1a with an undetectable svr last visit.  He is fibrosis F0.  Had completed 8 weeks of Harvoni.     Review of Systems  Constitutional:  Negative for fatigue.  Gastrointestinal:  Negative for diarrhea.  Skin:  Negative for rash.       Objective:   Physical Exam Eyes:     General: No scleral icterus. Pulmonary:     Effort: Pulmonary effort is normal.  Skin:    Findings: No rash.  Neurological:     Mental Status: She is alert.   SH: no alcohol, remains drug free        Assessment & Plan:

## 2022-09-01 NOTE — Assessment & Plan Note (Signed)
She is doing well after completing 8 weeks of Harvoni in the Spring with early viral load not detected.  She continues to feel well and pleasd with her progress.   Will check her viral load today to confirm cure. No follow up otherwise indicated.    I have personally spent  20 minutes involved in face-to-face and non-face-to-face activities for this patient on the day of the visit. Professional time spent includes the following activities: Preparing to see the patient (review of tests), Obtaining and/or reviewing separately obtained history (admission/discharge record), Performing a medically appropriate examination and/or evaluation , Ordering medications/tests/procedures, referring and communicating with other health care professionals, Documenting clinical information in the EMR, Independently interpreting results (not separately reported), Communicating results to the patient/family/caregiver, Counseling and educating the patient/family/caregiver and Care coordination (not separately reported).

## 2022-09-01 NOTE — Addendum Note (Signed)
Addended by: Harley Alto on: 09/01/2022 10:48 AM   Modules accepted: Orders

## 2022-11-09 ENCOUNTER — Other Ambulatory Visit: Payer: Self-pay | Admitting: Family

## 2022-11-09 DIAGNOSIS — Z1231 Encounter for screening mammogram for malignant neoplasm of breast: Secondary | ICD-10-CM

## 2022-12-21 ENCOUNTER — Ambulatory Visit: Payer: Medicare PPO

## 2023-01-07 ENCOUNTER — Ambulatory Visit
Admission: RE | Admit: 2023-01-07 | Discharge: 2023-01-07 | Disposition: A | Discharge status: Home / Self Care | Payer: Medicare PPO | Source: Ambulatory Visit | Attending: Family | Admitting: Family

## 2023-01-07 DIAGNOSIS — Z1231 Encounter for screening mammogram for malignant neoplasm of breast: Secondary | ICD-10-CM

## 2023-02-25 ENCOUNTER — Other Ambulatory Visit (HOSPITAL_COMMUNITY): Payer: Self-pay

## 2023-02-25 MED ORDER — ALBUTEROL SULFATE HFA 108 (90 BASE) MCG/ACT IN AERS
2.0000 | INHALATION_SPRAY | RESPIRATORY_TRACT | 0 refills | Status: AC | PRN
  Filled 2023-02-25: qty 6.7, 20d supply, fill #0

## 2023-02-25 MED ORDER — LEVOCETIRIZINE DIHYDROCHLORIDE 5 MG PO TABS
5.0000 mg | ORAL_TABLET | Freq: Every evening | ORAL | 1 refills | Status: AC
  Filled 2023-02-25: qty 90, 90d supply, fill #0

## 2023-03-01 ENCOUNTER — Ambulatory Visit: Payer: Medicare HMO | Admitting: Podiatry

## 2023-03-09 ENCOUNTER — Other Ambulatory Visit (HOSPITAL_COMMUNITY): Payer: Self-pay

## 2023-03-10 ENCOUNTER — Encounter: Payer: Self-pay | Admitting: Podiatry

## 2023-03-10 ENCOUNTER — Ambulatory Visit (INDEPENDENT_AMBULATORY_CARE_PROVIDER_SITE_OTHER): Payer: Medicare HMO | Admitting: Podiatry

## 2023-03-10 ENCOUNTER — Ambulatory Visit (INDEPENDENT_AMBULATORY_CARE_PROVIDER_SITE_OTHER): Payer: Medicare HMO

## 2023-03-10 ENCOUNTER — Other Ambulatory Visit (HOSPITAL_COMMUNITY): Payer: Self-pay

## 2023-03-10 DIAGNOSIS — M79671 Pain in right foot: Secondary | ICD-10-CM

## 2023-03-10 DIAGNOSIS — M7752 Other enthesopathy of left foot: Secondary | ICD-10-CM | POA: Diagnosis not present

## 2023-03-10 DIAGNOSIS — M7751 Other enthesopathy of right foot: Secondary | ICD-10-CM

## 2023-03-10 DIAGNOSIS — M779 Enthesopathy, unspecified: Secondary | ICD-10-CM

## 2023-03-10 DIAGNOSIS — M79672 Pain in left foot: Secondary | ICD-10-CM | POA: Diagnosis not present

## 2023-03-10 MED ORDER — DICLOFENAC SODIUM 75 MG PO TBEC: 75.0000 mg | DELAYED_RELEASE_TABLET | Freq: Two times a day (BID) | ORAL | 2 refills | Status: AC

## 2023-03-11 NOTE — Progress Notes (Signed)
 Subjective:   Patient ID: Mallory Bailey, female   DOB: 68 y.o.   MRN: [id number]   HPI Patient states that she started working again 3 months ago and has developed a lot of pain in the bottom of both feet arches and into the heels.  States it is mostly after she is work 4 to 5 hours and she was retired for 7 years before resuming this job.  Patient does not smoke and has to be active   Review of Systems  All other systems reviewed and are negative.       Objective:  Physical Exam Vitals and nursing note reviewed.  Constitutional:      Appearance: She is well-developed.  Pulmonary:     Effort: Pulmonary effort is normal.  Musculoskeletal:        General: Normal range of motion.  Skin:    General: Skin is warm.  Neurological:     Mental Status: She is alert.     Neurovascular status intact muscle strength found to be adequate range of motion adequate with patient noted to have moderate discomfort in the arch bilateral into the heel but no area of acute inflammation that I was able to identify.  Patient has good digital perfusion well-oriented x 3     Assessment:  Probability that this is an inflammatory condition and is more related to foot structure and the fact that she has to be on her feet at this time for extensive hours     Plan:  H&P reviewed and I went ahead today and I did dispensed fascial bracing to lift up the arches bilateral placed on diclofenac  advised on supportive shoes and reappoint 4 weeks.  May require orthotics or other treatment depending on response  X-rays indicate moderate depression of the arch small spur formation plantar heel no indication stress fracture arthritis

## 2023-04-08 ENCOUNTER — Ambulatory Visit (INDEPENDENT_AMBULATORY_CARE_PROVIDER_SITE_OTHER): Payer: Medicare HMO | Admitting: Podiatry

## 2023-04-08 DIAGNOSIS — Z91199 Patient's noncompliance with other medical treatment and regimen due to unspecified reason: Secondary | ICD-10-CM

## 2023-04-12 NOTE — Progress Notes (Signed)
Patient did not come in for appointment

## 2023-07-01 ENCOUNTER — Encounter: Payer: Self-pay | Admitting: General Practice

## 2023-07-01 DIAGNOSIS — Z122 Encounter for screening for malignant neoplasm of respiratory organs: Secondary | ICD-10-CM

## 2023-07-02 ENCOUNTER — Other Ambulatory Visit: Payer: Self-pay | Admitting: Family

## 2023-07-02 DIAGNOSIS — Z122 Encounter for screening for malignant neoplasm of respiratory organs: Secondary | ICD-10-CM

## 2023-07-19 ENCOUNTER — Encounter: Payer: Self-pay | Admitting: Family

## 2023-07-20 ENCOUNTER — Ambulatory Visit
Admission: RE | Admit: 2023-07-20 | Discharge: 2023-07-20 | Disposition: A | Discharge status: Home / Self Care | Source: Ambulatory Visit | Attending: Family | Admitting: Family

## 2023-07-20 DIAGNOSIS — Z122 Encounter for screening for malignant neoplasm of respiratory organs: Secondary | ICD-10-CM

## 2023-12-06 ENCOUNTER — Other Ambulatory Visit: Payer: Self-pay | Admitting: Family

## 2023-12-06 DIAGNOSIS — Z1231 Encounter for screening mammogram for malignant neoplasm of breast: Secondary | ICD-10-CM

## 2024-01-11 ENCOUNTER — Ambulatory Visit
Admission: RE | Admit: 2024-01-11 | Discharge: 2024-01-11 | Disposition: A | Source: Ambulatory Visit | Attending: Family

## 2024-01-11 DIAGNOSIS — Z1231 Encounter for screening mammogram for malignant neoplasm of breast: Secondary | ICD-10-CM

## 2024-01-31 ENCOUNTER — Encounter (HOSPITAL_COMMUNITY): Payer: Self-pay | Admitting: Emergency Medicine

## 2024-01-31 ENCOUNTER — Ambulatory Visit (HOSPITAL_COMMUNITY): Admission: EM | Admit: 2024-01-31 | Discharge: 2024-01-31 | Disposition: A | Discharge status: Home / Self Care

## 2024-01-31 ENCOUNTER — Ambulatory Visit (INDEPENDENT_AMBULATORY_CARE_PROVIDER_SITE_OTHER)

## 2024-01-31 DIAGNOSIS — R059 Cough, unspecified: Secondary | ICD-10-CM

## 2024-01-31 DIAGNOSIS — J069 Acute upper respiratory infection, unspecified: Secondary | ICD-10-CM

## 2024-01-31 MED ORDER — AZITHROMYCIN 250 MG PO TABS: 250.0000 mg | ORAL_TABLET | Freq: Every day | ORAL | 0 refills | Status: AC

## 2024-01-31 MED ORDER — BENZONATATE 100 MG PO CAPS: 100.0000 mg | ORAL_CAPSULE | Freq: Three times a day (TID) | ORAL | 0 refills | Status: AC

## 2024-01-31 NOTE — ED Triage Notes (Signed)
 Pt reports has cough that now getting up phlegm that is light green and getting headaches for over a week. Reports had lots of congestion in her chest. Taking Robitussin, Theraflu. Reports room mate has been sick.

## 2024-01-31 NOTE — Discharge Instructions (Signed)
 Take the antibiotics as prescribed and with food.  You can use the cough medicine every 8 hours to help suppress her cough.  I suggest taking over-the-counter Mucinex 1200 mg daily to help loosen secretions in addition to drinking at least 64 ounces of water daily.  For the ginger I suggest about a teaspoon daily but if you develop heartburn you can go down to half a teaspoon daily as needed.  Symptoms should improve with the antibiotics.  If no improvement or any changes return to clinic for reevaluation.

## 2024-01-31 NOTE — ED Provider Notes (Signed)
 " [facility]    CSN: [id number] Arrival date & time: 01/31/24  9197      History   Chief Complaint Chief Complaint  Patient presents with   Cough    HPI Mallory Bailey is a 68 y.o. female.   Patient presents to clinic over concern of cough, nasal congestion, sinus pressure and post nasal drip for 1.5 weeks.   Her roommate was concerned over how long she has been coughing and congested. Patient w/ hx of allergies and takes a daily antihistamine. Does not have wheezing, shortness of breath, fatigue or fevers.   Feels congestion in the chest and it is lingering. Has not had chest pain. Cough will be productive with white phlegm.   Taking ginger, lemon and honey tea, robitussin and theraflu.   The history is provided by the patient and medical records.  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      Home Medications    Prior to Admission medications  Medication Sig Start Date End Date Taking? Authorizing Provider  azithromycin  (ZITHROMAX ) 250 MG tablet Take 1 tablet (250 mg total) by mouth daily. Take first 2 tablets together, then 1 every day until finished. 01/31/24  Yes Daelyn Mozer  N, FNP  benzonatate (TESSALON) 100 MG capsule Take 1 capsule (100 mg total) by mouth every 8 (eight) hours. 01/31/24  Yes Jewelz Kobus  N, FNP  memantine  (NAMENDA) 5 MG tablet Take 5 mg by mouth daily. 11/25/23  Yes [provider]  albuterol  (VENTOLIN  HFA) 108 (90 Base) MCG/ACT inhaler Inhale 2 puffs into the lungs every 4 (four) hours as needed for shortness of breath for up to 30 days. 02/25/23     diclofenac  (VOLTAREN ) 75 MG EC tablet Take 1 tablet (75 mg total) by mouth 2 (two) times daily. 03/10/23   Magdalen Pasco RAMAN, DPM  Ledipasvir -Sofosbuvir  (HARVONI ) 90-400 MG TABS Take 1 tablet by mouth daily. 02/03/22   Waddell Alan PARAS, RPH-CPP  levocetirizine (XYZAL ) 5 MG tablet Take 1 tablet (5 mg total) by mouth every evening. 02/25/23     Multiple Vitamins-Minerals (MULTIVITAMIN WITH MINERALS) tablet Take 1 tablet by mouth daily.    [provider]  polyethylene glycol (MIRALAX / GLYCOLAX) 17 g packet Take 17 g by mouth daily.    [provider]    Family History Family History  Problem Relation Age of Onset   Dementia Mother    Colon cancer Neg Hx    Colon polyps Neg Hx    Esophageal cancer Neg Hx    Rectal cancer Neg Hx    Stomach cancer Neg Hx    Breast cancer Neg Hx     Social History  Social History[1]   Allergies   Pollen extract   Review of Systems Review of Systems  Per HPI  Physical Exam Triage Vital Signs ED Triage Vitals  Encounter Vitals Group     BP 01/31/24 0819 (!) 154/87     Girls Systolic BP Percentile --      Girls Diastolic BP Percentile --      Boys Systolic BP Percentile --      Boys Diastolic BP Percentile --      Pulse Rate 01/31/24 0819 73     Resp 01/31/24 0819 18     Temp 01/31/24 0819 97.9 F (36.6 C)     Temp Source 01/31/24 0819 Oral     SpO2 01/31/24 0819 94 %     Weight --      Height --      Head Circumference --      Peak Flow --      Pain Score 01/31/24 0817 0     Pain Loc --      Pain Education --      Exclude from Growth Chart --    No data found.  Updated Vital Signs BP (!) 154/87 (BP Location: Left Arm)   Pulse 73   Temp 97.9 F (36.6 C) (Oral)    Resp 18   SpO2 94%   Visual Acuity Right Eye Distance:   Left Eye Distance:   Bilateral Distance:    Right Eye Near:   Left Eye Near:    Bilateral Near:     Physical Exam Vitals and nursing note reviewed.  Constitutional:      Appearance: Normal appearance.  HENT:     Head: Normocephalic and atraumatic.     Right Ear: External ear normal.     Left Ear: External ear normal.     Nose: Congestion and rhinorrhea present.     Mouth/Throat:     Mouth: Mucous membranes are moist.  Eyes:     Conjunctiva/sclera: Conjunctivae normal.  Cardiovascular:     Rate and Rhythm: Normal rate and regular rhythm.     Heart sounds: Normal heart sounds. No murmur heard. Pulmonary:     Effort: Pulmonary effort is normal. No respiratory distress.     Breath sounds: Normal breath sounds. No wheezing.  Skin:    General: Skin is warm and dry.  Neurological:     General: No focal deficit present.     Mental Status: She is alert and oriented to person, place, and time.  Psychiatric:        Mood and Affect: Mood normal.        Behavior: Behavior normal.      UC Treatments / Results  Labs (all labs ordered are listed, but only abnormal results are displayed) Labs Reviewed - No data to display  EKG   Radiology DG Chest 2 View Result Date: 01/31/2024 EXAM: 2 VIEW(S) XRAY OF THE CHEST 01/31/2024 08:37:28 AM COMPARISON: 11/24/2021 CLINICAL HISTORY: cough x 1 week FINDINGS: LUNGS AND PLEURA: Unchanged asymmetric elevation of the left hemidiaphragm. No focal pulmonary opacity. No pleural effusion. No pneumothorax. HEART AND MEDIASTINUM: No acute abnormality of the cardiac and mediastinal silhouettes. BONES AND SOFT TISSUES: Curvature of the thoracic spine as convex towards the right. No acute osseous abnormality. IMPRESSION: 1. No acute findings. Electronically signed by: Waddell Calk MD 01/31/2024 08:40 AM EST RP Workstation: [id number]    Procedures Procedures (including critical care  time)  Medications Ordered in UC Medications - No  data to display  Initial Impression / Assessment and Plan / UC Course  I have reviewed the triage vital signs and the nursing notes.  Pertinent labs & imaging results that were available during my care of the patient were reviewed by me and considered in my medical decision making (see chart for details).  Vitals and triage reviewed, patient is hemodynamically stable.  Lungs vesicular, heart with regular rate and rhythm.  Congestion, rhinorrhea and postnasal drip present.  Chest x-ray obtained due to duration of symptoms.  By my interpretation shows elevated left hemidiaphragm, consistent with previous.  No infiltrate seen, confirmed with radiology overread.  Due to prolonged duration of symptoms will cover with azithromycin  for bacterial URI.  Symptomatic management for cough discussed.  Plan of care, follow-up care, and return precautions given, no questions at this time.    Final Clinical Impressions(s) / UC Diagnoses   Final diagnoses:  Cough, unspecified type  Upper respiratory tract infection, unspecified type     Discharge Instructions      Take the antibiotics as prescribed and with food.  You can use the cough medicine every 8 hours to help suppress her cough.  I suggest taking over-the-counter Mucinex 1200 mg daily to help loosen secretions in addition to drinking at least 64 ounces of water daily.  For the ginger I suggest about a teaspoon daily but if you develop heartburn you can go down to half a teaspoon daily as needed.  Symptoms should improve with the antibiotics.  If no improvement or any changes return to clinic for reevaluation.    ED Prescriptions     Medication Sig Dispense Auth. Provider   benzonatate (TESSALON) 100 MG capsule Take 1 capsule (100 mg total) by mouth every 8 (eight) hours. 21 capsule Dreama, Lenny Bouchillon  N, FNP   azithromycin  (ZITHROMAX ) 250 MG tablet Take 1 tablet (250 mg total) by mouth  daily. Take first 2 tablets together, then 1 every day until finished. 6 tablet Dreama, Ishi Danser  N, FNP      PDMP not reviewed this encounter.    [1]  Social History Tobacco Use   Smoking status: Former    Types: Cigarettes   Smokeless tobacco: Never   Tobacco comments:    it depends on the situation- none in the last week ,   Vaping Use   Vaping status: Never Used  Substance Use Topics   Alcohol use: Not Currently    Alcohol/week: 3.0 standard drinks of alcohol    Types: 2 Glasses of wine, 1 Shots of liquor per week    Comment: occasionally, when she goes out she states,   Drug use: Not Currently    Types: Cocaine, Marijuana    Comment: as of 11/04/21, occasional cocaine pt states     Dreama Katherleen SAILOR, FNP 01/31/24 0901  "

## 2024-02-08 ENCOUNTER — Encounter: Payer: Self-pay | Admitting: Family

## 2024-02-08 ENCOUNTER — Other Ambulatory Visit: Payer: Self-pay | Admitting: Family

## 2024-02-08 DIAGNOSIS — R918 Other nonspecific abnormal finding of lung field: Secondary | ICD-10-CM

## 2024-02-18 ENCOUNTER — Encounter: Payer: Self-pay | Admitting: Family

## 2024-02-22 ENCOUNTER — Ambulatory Visit
Admission: RE | Admit: 2024-02-22 | Discharge: 2024-02-22 | Disposition: A | Source: Ambulatory Visit | Attending: Family

## 2024-02-22 DIAGNOSIS — R918 Other nonspecific abnormal finding of lung field: Secondary | ICD-10-CM
